# Patient Record
Sex: Male | Born: 1995 | Race: Black or African American | Hispanic: No | Marital: Single | Smoking: Never smoker
Health system: Southern US, Community
[De-identification: ages and names within clinical notes are randomized; demographics above are authoritative.]

## PROBLEM LIST (undated history)

## (undated) DIAGNOSIS — E669 Obesity, unspecified: Secondary | ICD-10-CM

---
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
## 2019-12-23 ENCOUNTER — Emergency Department (HOSPITAL_COMMUNITY): Payer: Medicaid Other

## 2019-12-23 ENCOUNTER — Emergency Department (HOSPITAL_COMMUNITY)
Admission: EM | Admit: 2019-12-23 | Discharge: 2019-12-24 | Disposition: A | Discharge status: Home / Self Care | Payer: Medicaid Other | Attending: Emergency Medicine | Admitting: Emergency Medicine

## 2019-12-23 ENCOUNTER — Encounter (HOSPITAL_COMMUNITY): Payer: Self-pay | Admitting: Emergency Medicine

## 2019-12-23 ENCOUNTER — Other Ambulatory Visit: Payer: Self-pay

## 2019-12-23 DIAGNOSIS — R072 Precordial pain: Secondary | ICD-10-CM | POA: Insufficient documentation

## 2019-12-23 LAB — CBC
HCT: 45.2 % (ref 39.0–52.0)
Hemoglobin: 14.4 g/dL (ref 13.0–17.0)
MCH: 28.3 pg (ref 26.0–34.0)
MCHC: 31.9 g/dL (ref 30.0–36.0)
MCV: 88.8 fL (ref 80.0–100.0)
Platelets: 289 10*3/uL (ref 150–400)
RBC: 5.09 MIL/uL (ref 4.22–5.81)
RDW: 12.8 % (ref 11.5–15.5)
WBC: 6.7 10*3/uL (ref 4.0–10.5)
nRBC: 0 % (ref 0.0–0.2)

## 2019-12-23 MED ORDER — SODIUM CHLORIDE 0.9% FLUSH: 3.0000 mL | Freq: Once | INTRAVENOUS | Status: DC

## 2019-12-23 NOTE — ED Triage Notes (Signed)
Patient reports intermittent central chest pain/tightness onset this evening , denies SOB , no emesis or diaphoresis , no cough or fever.

## 2019-12-24 LAB — BASIC METABOLIC PANEL
Anion gap: 9 (ref 5–15)
BUN: 11 mg/dL (ref 6–20)
CO2: 28 mmol/L (ref 22–32)
Calcium: 9.1 mg/dL (ref 8.9–10.3)
Chloride: 104 mmol/L (ref 98–111)
Creatinine, Ser: 0.94 mg/dL (ref 0.61–1.24)
GFR calc Af Amer: >60 mL/min (ref >60)
GFR calc non Af Amer: >60 mL/min (ref >60)
Glucose, Bld: 98 mg/dL (ref 70–99)
Potassium: 3.9 mmol/L (ref 3.5–5.1)
Sodium: 141 mmol/L (ref 135–145)

## 2019-12-24 LAB — TROPONIN I (HIGH SENSITIVITY)
Troponin I (High Sensitivity): <2 ng/L (ref <18)
Troponin I (High Sensitivity): <2 ng/L (ref <18)

## 2019-12-24 LAB — PROTIME-INR
INR: 1 (ref 0.8–1.2)
Prothrombin Time: 12.9 seconds (ref 11.4–15.2)

## 2019-12-24 MED ORDER — KETOROLAC TROMETHAMINE 30 MG/ML IJ SOLN
60.0000 mg | Freq: Once | INTRAMUSCULAR | Status: AC
  Administered 2019-12-24: 04:00:00 60 mg via INTRAMUSCULAR
  Filled 2019-12-24: qty 2

## 2019-12-24 NOTE — ED Notes (Signed)
Patient verbalizes understanding of discharge instructions. Opportunity for questioning and answers were provided. Armband removed by staff, pt discharged from ED.  

## 2019-12-24 NOTE — Discharge Instructions (Addendum)
There are no signs of heart attack or blood clots.  All your blood testing and x-rays look good You can take ibuprofen 3 times a day for a week.  I have suspicion you have inflammation in your chest  Your caregiver has diagnosed you as having chest pain that is not specific for one problem, but does not require admission.  Chest pain comes from many different causes.  SEEK IMMEDIATE MEDICAL ATTENTION IF: You have severe chest pain, especially if the pain is crushing or pressure-like and spreads to the arms, back, neck, or jaw, or if you have sweating, nausea (feeling sick to your stomach), or shortness of breath. THIS IS AN EMERGENCY. Don't wait to see if the pain will go away. Get medical help at once. Call 911 or 0 (operator). DO NOT drive yourself to the hospital.  Your chest pain gets worse and does not go away with rest.  You have an attack of chest pain lasting longer than usual, despite rest and treatment with the medications your caregiver has prescribed.  You wake from sleep with chest pain or shortness of breath.  You feel dizzy or faint.  You have chest pain not typical of your usual pain for which you originally saw your caregiver.

## 2019-12-24 NOTE — ED Provider Notes (Signed)
[facility] EMERGENCY DEPARTMENT Provider Note   CSN: [id number] Arrival date & time: 12/23/19  2305     History Chief Complaint  Patient presents with  . Chest Pain    PEDRO WHITERS is a 24 y.o. male.  The history is provided by the patient.  Chest Pain Associated symptoms: no cough, no diaphoresis, no fever, no shortness of breath and no vomiting     HPI: A 24 year old patient presents for evaluation of chest pain. Initial onset of pain was more than 6 hours ago. The patient's chest pain is described as heaviness/pressure/tightness and is not worse with exertion. The patient's chest pain is not middle- or left-sided, is not well-localized, is not sharp and does not radiate to the arms/jaw/neck. The patient does not complain of nausea and denies diaphoresis. The patient has no history of stroke, has no history of peripheral artery disease, has not smoked in the past 90 days, denies any history of treated diabetes, has no relevant family history of coronary artery disease (first degree relative at less than age 51), is not hypertensive, has no history of hypercholesterolemia and does not have an elevated BMI (>=30).  Patient reports he has had squeezing chest pain for over a month.  He feels that it got worse tonight.  He reports it feels like "small hands are squeezing my heart " He has no other associated symptoms.  He denies fever/vomiting/cough/shortness of breath.  No diaphoresis.  No history of CAD/VTE.  She denies any significant anxiety  PMH-none Social History   Tobacco Use  . Smoking status: Never Smoker  . Smokeless tobacco: Never Used  Substance Use Topics  . Alcohol use: Never  . Drug use: Never    Home Medications Prior to Admission medications   Not on File    Allergies    Patient has no known allergies.  Review of Systems   Review of Systems  Constitutional: Negative for diaphoresis and fever.  Respiratory: Negative for cough and  shortness of breath.   Cardiovascular: Positive for chest pain.  Gastrointestinal: Negative for vomiting.  All other systems reviewed and are negative.   Physical Exam Updated Vital Signs BP 125/82   Pulse 92   Temp 98 F (36.7 C) (Oral)   Resp (!) 22   Ht 1.803 m (5\' 11" )   Wt 110 kg   SpO2 100%   BMI 33.82 kg/m   Physical Exam CONSTITUTIONAL: Well developed/well nourished HEAD: Normocephalic/atraumatic EYES: EOMI ENMT: Mucous membranes moist NECK: supple no meningeal signs SPINE/BACK:entire spine nontender CV: S1/S2 noted, no murmurs/rubs/gallops noted LUNGS: Lungs are clear to auscultation bilaterally, no apparent distress Chest-no tenderness to palpation ABDOMEN: soft, nontender NEURO: Pt is awake/alert/appropriate, moves all extremitiesx4.  No facial droop.   EXTREMITIES: pulses normal/equal, full ROM, no calf tenderness or edema SKIN: warm, color normal PSYCH: Flat affect, poor eye contact  ED Results / Procedures / Treatments   Labs (all labs ordered are listed, but only abnormal results are displayed) Labs Reviewed  BASIC METABOLIC PANEL  CBC  PROTIME-INR  TROPONIN I (HIGH SENSITIVITY)  TROPONIN I (HIGH SENSITIVITY)    EKG EKG Interpretation  Date/Time:  Thursday December 24 2019 03:17:03 EST Ventricular Rate:  77 PR Interval:    QRS Duration: 90 QT Interval:  325 QTC Calculation: 368 R Axis:   21 Text Interpretation: Sinus rhythm RSR' in V1 or V2, probably normal variant Probable inferior infarct, old Lateral leads are also involved No previous ECGs available  Confirmed by Ripley Fraise [phone]) on 12/24/2019 3:28:55 AM   Radiology DG Chest 2 View  Result Date: 12/23/2019 CLINICAL DATA:  Chest pain EXAM: CHEST - 2 VIEW COMPARISON:  11/10/2007 FINDINGS: The heart size and mediastinal contours are within normal limits. Both lungs are clear. The visualized skeletal structures are unremarkable. IMPRESSION: No active cardiopulmonary disease.  Electronically Signed   By: Donavan Foil M.D.   On: 12/23/2019 23:31    Procedures Procedures   Medications Ordered in ED Medications  sodium chloride flush (NS) 0.9 % injection 3 mL (has no administration in time range)  ketorolac (TORADOL) 30 MG/ML injection 60 mg (60 mg Intramuscular Given 12/24/19 0344)    ED Course  I have reviewed the triage vital signs and the nursing notes.  Pertinent labs & imaging results that were available during my care of the patient were reviewed by me and considered in my medical decision making (see chart for details).    MDM Rules/Calculators/A&P HEAR Score: 1                    Patient reports chest pain for over a month that appeared to worsen at night.  He reports it feels like a squeezing, but no other acute symptom Patient feels improved and is requesting discharge home.  Low suspicion for ACS/PE/dissection at this time.  We discussed return precautions He declines to have me call his family Final Clinical Impression(s) / ED Diagnoses Final diagnoses:  Precordial pain    Rx / DC Orders ED Discharge Orders    None       Ripley Fraise, MD 12/24/19 0501

## 2021-10-14 ENCOUNTER — Telehealth: Payer: Medicaid Other | Admitting: Nurse Practitioner

## 2021-10-14 DIAGNOSIS — L089 Local infection of the skin and subcutaneous tissue, unspecified: Secondary | ICD-10-CM

## 2021-10-14 MED ORDER — SULFAMETHOXAZOLE-TRIMETHOPRIM 800-160 MG PO TABS: 1.0000 | ORAL_TABLET | Freq: Two times a day (BID) | ORAL | 0 refills | Status: AC

## 2021-10-14 NOTE — Progress Notes (Signed)
Virtual Visit Consent   Cody Weaver, you are scheduled for a virtual visit with a [facility] provider today.     Just as with appointments in the office, your consent must be obtained to participate.  Your consent will be active for this visit and any virtual visit you may have with one of our providers in the next 365 days.     If you have a MyChart account, a copy of this consent can be sent to you electronically.  All virtual visits are billed to your insurance company just like a traditional visit in the office.    As this is a virtual visit, video technology does not allow for your provider to perform a traditional examination.  This may limit your provider's ability to fully assess your condition.  If your provider identifies any concerns that need to be evaluated in person or the need to arrange testing (such as labs, EKG, etc.), we will make arrangements to do so.     Although advances in technology are sophisticated, we cannot ensure that it will always work on either your end or our end.  If the connection with a video visit is poor, the visit may have to be switched to a telephone visit.  With either a video or telephone visit, we are not always able to ensure that we have a secure connection.     I need to obtain your verbal consent now.   Are you willing to proceed with your visit today?    LAYTON NAVES has provided verbal consent on 10/14/2021 for a virtual visit (video or telephone).   Viviano Simas, FNP   Date: 10/14/2021 1:02 PM   Virtual Visit via Video Note   I, Viviano Simas, connected with  Cody Weaver  ([id number], 07/02/1996) on 10/14/21 at  1:00 PM EST by a video-enabled telemedicine application and verified that I am speaking with the correct person using two identifiers.  Location: Patient: Virtual Visit Location Patient: Home Provider: Virtual Visit Location Provider: Home Office   I discussed the limitations of evaluation and management by  telemedicine and the availability of in person appointments. The patient expressed understanding and agreed to proceed.    History of Present Illness: Cody Weaver is a 25 y.o. who identifies as a male who was assigned male at birth, and is being seen today with complaints of a rash on his scalp for the past 2 months. He was seen by a dermatologist and was diagnosed with folliculitis. He has been treated with ketoconazole shampoo, doxycyline oral and clindamycin topical.   Since that time the rash has continued and become more painful. The rash has spread and he feels that it is in a trail pattern. The rash only hurts it does not itch.   If he rolls over onto the area it will wake him up from pain.   He finished the oral antibiotics two months ago.    Problems: There are no problems to display for this patient.   Allergies: No Known Allergies Medications: No current outpatient medications on file.  Observations/Objective: Patient is well-developed, well-nourished in no acute distress.  Resting comfortably at home.  Head is normocephalic, atraumatic.  No labored breathing.  Speech is clear and coherent with logical content.  Patient is alert and oriented at baseline.  Rash to scalp left side with loss of hair to areas affected red and swollen scalp without obvious drainage   Assessment and Plan: 1. Skin  infection  - sulfamethoxazole-trimethoprim (BACTRIM DS) 800-160 MG tablet; Take 1 tablet by mouth 2 (two) times daily for 10 days.  Dispense: 20 tablet; Refill: 0    Stop any topical regimen, follow up with dermatology next week for assessment and culture as needed  Follow Up Instructions: I discussed the assessment and treatment plan with the patient. The patient was provided an opportunity to ask questions and all were answered. The patient agreed with the plan and demonstrated an understanding of the instructions.  A copy of instructions were sent to the patient via MyChart  unless otherwise noted below.     The patient was advised to call back or seek an in-person evaluation if the symptoms worsen or if the condition fails to improve as anticipated.  Time:  I spent  15 minutes with the patient via telehealth technology discussing the above problems/concerns.    Viviano Simas, FNP

## 2021-12-13 ENCOUNTER — Other Ambulatory Visit: Payer: Self-pay | Admitting: Nurse Practitioner

## 2021-12-13 DIAGNOSIS — N5082 Scrotal pain: Secondary | ICD-10-CM

## 2021-12-17 ENCOUNTER — Telehealth: Payer: Self-pay

## 2021-12-17 NOTE — Progress Notes (Unsigned)
The patient no-showed for appointment despite this provider sending direct link, reaching out via phone with no response and waiting for at least 10 minutes from appointment time for patient to join. They will be marked as a NS for this appointment/time.   Genelle Economou J Sydne Krahl-Warren, NP    

## 2022-01-16 ENCOUNTER — Telehealth: Payer: Self-pay | Admitting: Physician Assistant

## 2022-01-16 DIAGNOSIS — L989 Disorder of the skin and subcutaneous tissue, unspecified: Secondary | ICD-10-CM

## 2022-01-16 NOTE — Progress Notes (Signed)
?Virtual Visit Consent  ? ?Cody Weaver, you are scheduled for a virtual visit with a [facility] provider today.   ?  ?Just as with appointments in the office, your consent must be obtained to participate.  Your consent will be active for this visit and any virtual visit you may have with one of our providers in the next 365 days.   ?  ?If you have a MyChart account, a copy of this consent can be sent to you electronically.  All virtual visits are billed to your insurance company just like a traditional visit in the office.   ? ?As this is a virtual visit, video technology does not allow for your provider to perform a traditional examination.  This may limit your provider's ability to fully assess your condition.  If your provider identifies any concerns that need to be evaluated in person or the need to arrange testing (such as labs, EKG, etc.), we will make arrangements to do so.   ?  ?Although advances in technology are sophisticated, we cannot ensure that it will always work on either your end or our end.  If the connection with a video visit is poor, the visit may have to be switched to a telephone visit.  With either a video or telephone visit, we are not always able to ensure that we have a secure connection.    ? ?I need to obtain your verbal consent now.   Are you willing to proceed with your visit today?  ?  ?Cody Weaver has provided verbal consent on 01/16/2022 for a virtual visit (video or telephone). ?  ?Piedad Climes, PA-C  ? ?Date: 01/16/2022 12:11 PM ? ? ?Virtual Visit via Video Note  ? ?IPiedad Climes, connected with  Cody Weaver  ([id number], [birth year]) on 01/16/22 at 10:45 AM EDT by a video-enabled telemedicine application and verified that I am speaking with the correct person using two identifiers. ? ?Location: ?Patient: Virtual Visit Location Patient: Home ?Provider: Virtual Visit Location Provider: Home Office ?  ?I discussed the limitations of evaluation and  management by telemedicine and the availability of in person appointments. The patient expressed understanding and agreed to proceed.   ? ?History of Present Illness: ?Cody Weaver is a 26 y.o. who identifies as a male who was assigned male at birth, and is being seen today for  ongoing lesions of scalp. Notes issue starting about 5-6 months ago in which he started with development of rash and pustules/boils of scalp. Was seen by PCP and treated for folliculitis with continued symptoms. As such was sent to Dermatology. Notes having a bad experience as the provider spent < 5 minutes with him and gave more antibiotics. Notes he has scarring of scalp from these boils and some lesions under the skin that are non-tender or painful but causing loss of hair of overlying scalp. Is unsure what to do. Denies any painful or tender lesions at present, drainage, fever, etc.  ? ? ?HPI: HPI  ?Problems: There are no problems to display for this patient. ?  ?Allergies: No Known Allergies ?Medications:  ?Current Outpatient Medications:  ?  clindamycin (CLINDAGEL) 1 % gel, SMARTSIG:1 Sparingly Topical Twice Daily, Disp: , Rfl:  ?  fluconazole (DIFLUCAN) 200 MG tablet, Take 200 mg by mouth daily., Disp: , Rfl:  ?  ketoconazole (NIZORAL) 2 % shampoo, Apply topically., Disp: , Rfl:  ?  Vitamin D, Ergocalciferol, (DRISDOL) 1.25 MG (50000 UNIT) CAPS capsule,  Take 50,000 Units by mouth once a week., Disp: , Rfl:  ? ?Observations/Objective: ?Patient is well-developed, well-nourished in no acute distress.  ?Resting comfortably at home.  ?Head is normocephalic, atraumatic.  ?No labored breathing. ?Speech is clear and coherent with logical content.  ?Patient is alert and oriented at baseline.  ? ?Assessment and Plan: ?1. Lesion of skin of scalp ? ?Ongoing issue with recurrent folliculitis (question HS actually giving patient note of boils and scarring) without symptoms of active infection. Also with lesion of scalp sounding more like a  cyst. Bad prior experience with a dermatology office. Recommendations given. He can contact directly if insurance does not require a referral. Supportive measures reviewed.  ? ?Follow Up Instructions: ?I discussed the assessment and treatment plan with the patient. The patient was provided an opportunity to ask questions and all were answered. The patient agreed with the plan and demonstrated an understanding of the instructions.  A copy of instructions were sent to the patient via MyChart unless otherwise noted below.  ? ?The patient was advised to call back or seek an in-person evaluation if the symptoms worsen or if the condition fails to improve as anticipated. ? ?Time:  ?I spent 15 minutes with the patient via telehealth technology discussing the above problems/concerns.   ? ?Piedad Climes, PA-C ?

## 2022-01-16 NOTE — Patient Instructions (Addendum)
?  Cody Weaver, thank you for joining Piedad Climes, PA-C for today's virtual visit.  While this provider is not your primary care provider (PCP), if your PCP is located in our provider database this encounter information will be shared with them immediately following your visit. ? ?Consent: ?(Patient) Cody Weaver provided verbal consent for this virtual visit at the beginning of the encounter. ? ?Current Medications: ? ?Current Outpatient Medications:  ?  clindamycin (CLINDAGEL) 1 % gel, SMARTSIG:1 Sparingly Topical Twice Daily, Disp: , Rfl:  ?  fluconazole (DIFLUCAN) 200 MG tablet, Take 200 mg by mouth daily., Disp: , Rfl:  ?  ketoconazole (NIZORAL) 2 % shampoo, Apply topically., Disp: , Rfl:  ?  Vitamin D, Ergocalciferol, (DRISDOL) 1.25 MG (50000 UNIT) CAPS capsule, Take 50,000 Units by mouth once a week., Disp: , Rfl:   ? ?Medications ordered in this encounter:  ?No orders of the defined types were placed in this encounter. ?  ? ?*If you need refills on other medications prior to your next appointment, please contact your pharmacy* ? ?Follow-Up: ?Call back or seek an in-person evaluation if the symptoms worsen or if the condition fails to improve as anticipated. ? ?Other Instructions ?Keep scalp clean and dry. ?I recommend getting some OTC Hibiclens solution to apply 1-2 x week during your shower and rinsing off. This will cut back on the bacterial load in the area and make infections less likely.  ? ?I would recommend looking into Dr. Nicholas Lose with [facility] [phone]), or [facility] [phone]) to get set up with a provider. You may need to contact your PCP to place a referral for you.  ? ? ?If you have been instructed to have an in-person evaluation today at a local Urgent Care facility, please use the link below. It will take you to a list of all of our available [facility], including address, phone number and hours of operation. Please do not  delay care.  ?[facility] ? ?If you or a family member do not have a primary care provider, use the link below to schedule a visit and establish care. When you choose a [facility] primary care physician or advanced practice provider, you gain a long-term partner in health. ?Find a Primary Care Provider ? ?Learn more about [facility]'s in-office and virtual care options: ?[facility] - Get Care Now  ?

## 2022-02-10 ENCOUNTER — Ambulatory Visit (HOSPITAL_COMMUNITY): Payer: Self-pay

## 2022-02-12 ENCOUNTER — Ambulatory Visit (HOSPITAL_COMMUNITY)
Admission: RE | Admit: 2022-02-12 | Discharge: 2022-02-12 | Disposition: A | Discharge status: Home / Self Care | Payer: Medicaid Other | Source: Ambulatory Visit | Attending: Sports Medicine | Admitting: Sports Medicine

## 2022-02-12 ENCOUNTER — Encounter (HOSPITAL_COMMUNITY): Payer: Self-pay

## 2022-02-12 VITALS — BP 117/89 | HR 84 | Temp 97.5°F | Resp 18

## 2022-02-12 DIAGNOSIS — L7211 Pilar cyst: Secondary | ICD-10-CM

## 2022-02-12 DIAGNOSIS — L739 Follicular disorder, unspecified: Secondary | ICD-10-CM

## 2022-02-12 DIAGNOSIS — L299 Pruritus, unspecified: Secondary | ICD-10-CM

## 2022-02-12 DIAGNOSIS — R4589 Other symptoms and signs involving emotional state: Secondary | ICD-10-CM

## 2022-02-12 NOTE — ED Provider Notes (Signed)
?[facility] ? ? ? ?CSN: [id number] ?Arrival date & time: 02/12/22  1031 ? ? ?  ? ?History   ?Chief Complaint ?Chief Complaint  ?Patient presents with  ? Abscess  ? ? ?HPI ?KEYSHAWN SNIDE is a 26 y.o. male here for reported abscess ? ?Patient reports he has abscess-like lesion on the left side of the scalp just superior to his left ear. He states this has grown in size slightly over the last few months. Initially noticed it about 5 months ago. States sometimes it will spontaneously drain small amount of yellow-fluid, but has never gone away. Hurts at times when he lays on that side. ? ?He also states he has folliculitis on the tops of his head. He has been seen for this before at UC/ED and states saw a dermatologist about 2 months ago and was diagnosed with folliculitis. He has been treated in the past with: ?Has taken diflucan before ?Used Nizoral shampoo and clindamycin 1% gel - helped with itching and burning ? ?Has seen dermatology in the past - about 2 months ago, unsure where this was located. ? ?Otherwise, he feels well. Denies any fever/chills. No current headaches or dizziness. He would like the lesion drained today. ? ?History reviewed. No pertinent past medical history. ? ?There are no problems to display for this patient. ? ? ?History reviewed. No pertinent surgical history. ? ? ? ? ?Home Medications   ? ?Prior to Admission medications   ?Medication Sig Start Date End Date Taking? Authorizing Provider  ?clindamycin (CLINDAGEL) 1 % gel SMARTSIG:1 Sparingly Topical Twice Daily 08/10/21   [provider]  ?fluconazole (DIFLUCAN) 200 MG tablet Take 200 mg by mouth daily. 06/12/21   [provider]  ?ketoconazole (NIZORAL) 2 % shampoo Apply topically. 09/28/21   [provider]  ?Vitamin D, Ergocalciferol, (DRISDOL) 1.25 MG (50000 UNIT) CAPS capsule Take 50,000 Units by mouth once a week. 09/28/21   [provider]  ? ? ?Family History ?History reviewed. No  pertinent family history. ? ?Social History ?Social History  ? ?Tobacco Use  ? Smoking status: Never  ? Smokeless tobacco: Never  ?Substance Use Topics  ? Alcohol use: Never  ? Drug use: Never  ? ? ? ?Allergies   ?Patient has no known allergies. ? ? ?Review of Systems ?Review of Systems  ?Constitutional:  Negative for activity change, chills and fever.  ?HENT:    ?     + mass on left scalp ?+ pruritic scalp ?+ loss of hair on top of scalp  ?Eyes:  Negative for visual disturbance.  ?Neurological:  Negative for dizziness and headaches.  ? ? ?Physical Exam ?Triage Vital Signs ?ED Triage Vitals  ?Enc Vitals Group  ?   BP 02/12/22 1056 117/89  ?   Pulse Rate 02/12/22 1056 84  ?   Resp 02/12/22 1056 18  ?   Temp 02/12/22 1056 (!) 97.5 ?F (36.4 ?C)  ?   Temp Source 02/12/22 1056 Oral  ?   SpO2 02/12/22 1056 97 %  ?   Weight --   ?   Height --   ?   Head Circumference --   ?   Peak Flow --   ?   Pain Score 02/12/22 1055 5  ?   Pain Loc --   ?   Pain Edu? --   ?   Excl. in [place]? --   ? ?No data found. ? ?Updated Vital Signs ?BP 117/89 (BP Location: Right  Arm)   Pulse 84   Temp (!) 97.5 ?F (36.4 ?C) (Oral)   Resp 18   SpO2 97%  ? ? ?Physical Exam ?Constitutional:   ?   General: He is not in acute distress. ?   Appearance: Normal appearance. He is not toxic-appearing.  ?HENT:  ?   Head: Normocephalic.  ?   Comments: + raised nodular like lesion on top of scalp with hair loss ?+  left scalp with raised cystic mass approx 3 x 3 cm, slightly fluctuant ?No surrounding erythema ?+ pruritis on top of scalp ?   Mouth/Throat:  ?   Mouth: Mucous membranes are moist.  ?Eyes:  ?   Extraocular Movements: Extraocular movements intact.  ?   Conjunctiva/sclera: Conjunctivae normal.  ?   Pupils: Pupils are equal, round, and reactive to light.  ?Cardiovascular:  ?   Rate and Rhythm: Normal rate.  ?Pulmonary:  ?   Effort: Pulmonary effort is normal. No respiratory distress.  ?Musculoskeletal:  ?   Cervical back: Normal range of motion.   ?Skin: ?   Capillary Refill: Capillary refill takes less than 2 seconds.  ?   Findings: Erythema present.  ?   Comments: + pruritis  ?Neurological:  ?   Mental Status: He is alert.  ?Psychiatric:  ?   Comments: + depressed mood ?+ flat affect  ? ? ? ?UC Treatments / Results  ?Labs ?(all labs ordered are listed, but only abnormal results are displayed) ?Labs Reviewed - No data to display ? ?EKG ? ? ?Radiology ?No results found. ? ?Procedures ?Procedures (including critical care time) ? ?Medications Ordered in UC ?Medications - No data to display ? ?Initial Impression / Assessment and Plan / UC Course  ?I have reviewed the triage vital signs and the nursing notes. ? ?Pertinent labs & imaging results that were available during my care of the patient were reviewed by me and considered in my medical decision making (see chart for details). ? ?  ? ?Patient presents with about 5 months of follicular like reaction on the top of the scalp that is pruritic.  Also appears on the left side of the scalp just superior to the ear with what looks to be a pilar cyst versus sebaceous cyst of the scalp.  This is rather large, approximately 3 x 3 cm in nature.  This is causing cosmetic concern and some pain with sleeping on that side.  We discussed options such as attempted removal, however due to the cystic structure of the mass, this would likely need removal from otology or skin surgery.  Information with dermatology and skin surgery center provided for patient to follow-up with.  He may also follow-up with the previous dermatologist he saw.  He is to continue the Nizoral shampoo and clindamycin gel topically.  Return precautions provided. ?Final Clinical Impressions(s) / UC Diagnoses  ? ?Final diagnoses:  ?Folliculitis  ?Scalp itch  ?Pilar cyst of scalp  ?Flat affect  ? ? ? ?Discharge Instructions   ? ?  ?Continue Nizoral shampoo and clindamycin gel on the top of the head ? ?Call 1 of these dermatology offices for removal of your  sebaceous cyst/pilar cyst on the side of your head ? ? ? ? ?ED Prescriptions   ?None ?  ? ?PDMP not reviewed this encounter. ?  Elba Barman, DO ?02/12/22 1154 ? ?

## 2022-02-12 NOTE — Discharge Instructions (Signed)
Continue Nizoral shampoo and clindamycin gel on the top of the head ? ?Call 1 of these dermatology offices for removal of your sebaceous cyst/pilar cyst on the side of your head ?

## 2022-02-12 NOTE — ED Triage Notes (Signed)
Pt presents with an abscess on the L side of the head.  ?

## 2022-03-14 ENCOUNTER — Emergency Department (HOSPITAL_COMMUNITY)
Admission: EM | Admit: 2022-03-14 | Discharge: 2022-03-14 | Disposition: A | Discharge status: Home / Self Care | Payer: Medicaid Other | Attending: Emergency Medicine | Admitting: Emergency Medicine

## 2022-03-14 ENCOUNTER — Encounter (HOSPITAL_COMMUNITY): Payer: Self-pay | Admitting: Emergency Medicine

## 2022-03-14 ENCOUNTER — Emergency Department (HOSPITAL_COMMUNITY): Payer: Medicaid Other

## 2022-03-14 ENCOUNTER — Other Ambulatory Visit: Payer: Self-pay

## 2022-03-14 DIAGNOSIS — R0789 Other chest pain: Secondary | ICD-10-CM

## 2022-03-14 DIAGNOSIS — K219 Gastro-esophageal reflux disease without esophagitis: Secondary | ICD-10-CM | POA: Insufficient documentation

## 2022-03-14 HISTORY — DX: Obesity, unspecified: E66.9

## 2022-03-14 LAB — BASIC METABOLIC PANEL
Anion gap: 8 (ref 5–15)
BUN: 13 mg/dL (ref 6–20)
CO2: 24 mmol/L (ref 22–32)
Calcium: 9.4 mg/dL (ref 8.9–10.3)
Chloride: 106 mmol/L (ref 98–111)
Creatinine, Ser: 0.89 mg/dL (ref 0.61–1.24)
GFR, Estimated: >60 mL/min (ref >60)
Glucose, Bld: 94 mg/dL (ref 70–99)
Potassium: 4 mmol/L (ref 3.5–5.1)
Sodium: 138 mmol/L (ref 135–145)

## 2022-03-14 LAB — CBC
HCT: 46.7 % (ref 39.0–52.0)
Hemoglobin: 15.1 g/dL (ref 13.0–17.0)
MCH: 28.5 pg (ref 26.0–34.0)
MCHC: 32.3 g/dL (ref 30.0–36.0)
MCV: 88.3 fL (ref 80.0–100.0)
Platelets: 262 10*3/uL (ref 150–400)
RBC: 5.29 MIL/uL (ref 4.22–5.81)
RDW: 13.1 % (ref 11.5–15.5)
WBC: 5.6 10*3/uL (ref 4.0–10.5)
nRBC: 0 % (ref 0.0–0.2)

## 2022-03-14 LAB — TROPONIN I (HIGH SENSITIVITY): Troponin I (High Sensitivity): 3 ng/L (ref <18)

## 2022-03-14 MED ORDER — DICYCLOMINE HCL 20 MG PO TABS: 20.0000 mg | ORAL_TABLET | Freq: Two times a day (BID) | ORAL | 0 refills | Status: DC

## 2022-03-14 MED ORDER — OMEPRAZOLE 20 MG PO CPDR: 20.0000 mg | DELAYED_RELEASE_CAPSULE | Freq: Every day | ORAL | 1 refills | Status: AC

## 2022-03-14 MED ORDER — DICYCLOMINE HCL 10 MG PO CAPS
10.0000 mg | ORAL_CAPSULE | Freq: Once | ORAL | Status: AC
Start: 2022-03-14 — End: 2022-03-14
  Administered 2022-03-14: 10 mg via ORAL
  Filled 2022-03-14: qty 1

## 2022-03-14 MED ORDER — ALUM & MAG HYDROXIDE-SIMETH 200-200-20 MG/5ML PO SUSP
30.0000 mL | Freq: Once | ORAL | Status: AC
  Administered 2022-03-14: 30 mL via ORAL
  Filled 2022-03-14: qty 30

## 2022-03-14 MED ORDER — LIDOCAINE VISCOUS HCL 2 % MT SOLN
15.0000 mL | Freq: Once | OROMUCOSAL | Status: AC
  Administered 2022-03-14: 15 mL via ORAL
  Filled 2022-03-14: qty 15

## 2022-03-14 NOTE — Discharge Instructions (Addendum)
I believe you are having symptoms of acid reflux.  I have attached information about this to these discharge papers.  Avoid fried and spicy foods for the time being.  I have also sent medication to your pharmacy. ? ?Omeprazole is the first medication.  This should be taken on an empty stomach in the morning and you should not eat anything for 30 minutes after.  Dicyclomine is a second pill that is a twice daily medication.  Follow-up with the clinic attached to these discharge papers if you are no longer established with Dr. Radene Ou. ? ?Return with any worsening symptoms. ?

## 2022-03-14 NOTE — ED Triage Notes (Signed)
Patient arrives ambulatory c/o central chest tightness, feels like someone his grabbing and squeezing his heart x 2 days.  ?

## 2022-03-14 NOTE — ED Provider Triage Note (Signed)
Emergency Medicine Provider Triage Evaluation Note  Cody Weaver , a 26 y.o. male  was evaluated in triage.  Pt complains of 7-8 months ago had a similar episode. 2 days ago in the AM had pressure but it went away similar ep today. Now it doesn't "hurt but is uncomfortable"   Sternal and nonradiating Non exertional  No SOB, nausea, vomiting, sweating  No hx of HTN, tobacco, DM, HLD  No drug use  Review of Systems  Positive: CP Negative: Fever   Physical Exam  BP (!) 136/93 (BP Location: Left Arm)   Pulse 80   Temp 98.5 F (36.9 C) (Oral)   Resp 18   Ht 5\' 11"  (1.803 m)   Wt 131.1 kg   SpO2 100%   BMI 40.31 kg/m  Gen:   Awake, no distress   Resp:  Normal effort MSK:   Moves extremities without difficulty  Other:    Medical Decision Making  Medically screening exam initiated at 12:51 PM.  Appropriate orders placed.  was informed that the remainder of the evaluation will be completed by another provider, this initial triage assessment does not replace that evaluation, and the importance of remaining in the ED until their evaluation is complete.  He thinks it is stress.    [name], Gailen Shelter 03/14/22 1255

## 2022-03-14 NOTE — ED Provider Notes (Signed)
?[facility] EMERGENCY DEPARTMENT ?Provider Note ? ? ?CSN: [id number] ?Arrival date & time: 03/14/22  1206 ? ?  ? ?History ? ?Chief Complaint  ?Patient presents with  ? Chest Pain  ? ? ?Cody Weaver is a 26 y.o. male presenting with a complaint of chest tightness.  [place] Spaniel that this started today go away.  This happened 1 time 7 years ago and it went away on its own however he cannot get this to go away.  He is trying any medications.  No history of ACS or known medical history in general.  Denies recent travel, surgery, history of DVT/PE or tobacco use.  Says the pain does not radiate and feels just like a squeezing in his chest.  Says that he also may be under increased stress.  He also believes that this started after eating a spicy chicken sandwich. ? ?Chest Pain ?Associated symptoms: no cough, no palpitations and no shortness of breath   ? ?  ? ?Home Medications ?Prior to Admission medications   ?Medication Sig Start Date End Date Taking? Authorizing Provider  ?clindamycin (CLINDAGEL) 1 % gel SMARTSIG:1 Sparingly Topical Twice Daily 08/10/21   [provider]  ?fluconazole (DIFLUCAN) 200 MG tablet Take 200 mg by mouth daily. 06/12/21   [provider]  ?ketoconazole (NIZORAL) 2 % shampoo Apply topically. 09/28/21   [provider]  ?Vitamin D, Ergocalciferol, (DRISDOL) 1.25 MG (50000 UNIT) CAPS capsule Take 50,000 Units by mouth once a week. 09/28/21   [provider]  ?   ? ?Allergies    ?Patient has no known allergies.   ? ?Review of Systems   ?Review of Systems  ?Respiratory:  Positive for chest tightness. Negative for cough and shortness of breath.   ?Cardiovascular:  Negative for chest pain and palpitations.  ? ?Physical Exam ?Updated Vital Signs ?BP 119/74 (BP Location: Right Arm)   Pulse 83   Temp 97.6 ?F (36.4 ?C) (Oral)   Resp 16   Ht 5\' 11"  (1.803 m)   Wt 131.1 kg   SpO2 100%   BMI 40.31 kg/m?  ?Physical Exam ?Vitals and nursing note reviewed.   ?Constitutional:   ?   General: He is not in acute distress. ?   Appearance: Normal appearance. He is not ill-appearing.  ?HENT:  ?   Head: Normocephalic and atraumatic.  ?Eyes:  ?   General: No scleral icterus. ?   Conjunctiva/sclera: Conjunctivae normal.  ?Cardiovascular:  ?   Rate and Rhythm: Normal rate and regular rhythm.  ?Pulmonary:  ?   Effort: Pulmonary effort is normal. No respiratory distress.  ?   Breath sounds: Normal breath sounds.  ?Skin: ?   Findings: No rash.  ?Neurological:  ?   Mental Status: He is alert.  ?Psychiatric:     ?   Mood and Affect: Mood normal.  ? ? ?ED Results / Procedures / Treatments   ?Labs ?(all labs ordered are listed, but only abnormal results are displayed) ?Labs Reviewed  ?BASIC METABOLIC PANEL  ?CBC  ?TROPONIN I (HIGH SENSITIVITY)  ? ? ?EKG ?EKG Interpretation ? ?Date/Time:  Wednesday Mar 14 2022 12:37:00 EDT ?Ventricular Rate:  80 ?PR Interval:  174 ?QRS Duration: 82 ?QT Interval:  348 ?QTC Calculation: 401 ?R Axis:   58 ?Text Interpretation: Normal sinus rhythm Normal ECG Confirmed by [id number] (Cathren Laine) on 03/14/2022 3:22:20 PM ? ?Radiology ?DG Chest 2 View ? ?Result Date: 03/14/2022 ?CLINICAL DATA:  chest pain EXAM: CHEST - 2 VIEW  COMPARISON:  December 23, 2019 FINDINGS: The heart size and mediastinal contours are within normal limits. Both lungs are clear. The visualized skeletal structures are unremarkable. IMPRESSION: No active cardiopulmonary disease. Electronically Signed   By: Marjo Bicker M.D.   On: 03/14/2022 13:07   ? ?Procedures ?Procedures  ? ?Medications Ordered in ED ?Medications  ?alum & mag hydroxide-simeth (MAALOX/MYLANTA) 200-200-20 MG/5ML suspension 30 mL (has no administration in time range)  ?  And  ?lidocaine (XYLOCAINE) 2 % viscous mouth solution 15 mL (has no administration in time range)  ?dicyclomine (BENTYL) 10 MG/5ML solution 10 mg (has no administration in time range)  ? ? ?ED Course/ Medical Decision Making/ A&P ?  ?                         ?Medical Decision Making ?Amount and/or Complexity of Data Reviewed ?Labs: ordered. ?Radiology: ordered. ? ?Risk ?OTC drugs. ?Prescription drug management. ? ? ?This patient presents to the ED for concern of chest tightness. The emergent differential diagnosis of chest pain includes: Acute coronary syndrome, pericarditis, aortic dissection, pulmonary embolism, tension pneumothorax, and esophageal rupture. ? ?  ?This is not an exhaustive differential.  ?  ?Past Medical History / Co-morbidities / Social History: ?None ?  ?Additional history: ?Additional history obtained from internal chart review patient presented the same way in 2021. ?  ?Physical Exam: ?Physical exam performed.  No pertinent findings ? ?Lab Tests: ?I ordered, and personally interpreted labs.  No pertinent results ?  ?Imaging Studies: ?I ordered imaging studies including chest x-ray. I independently visualized and interpreted imaging which showed no abnormalities. I agree with the radiologist interpretation. ? ?  ?Medications: ?I ordered medication including GI cocktail. Reevaluation of the patient after these medicines showed that the patient improved. I have reviewed the patients home medicines and have made adjustments as needed. ?  ?Disposition: ?After consideration of the diagnostic results and the patients response to treatment, I feel that he does not meet admission criteria.  I did consider admission due to the severity of the complaint of chest pain however patient's cardiac work-up is negative.  Low suspicion for PE as he is PERC negative.  Believe that patient is experiencing reflux secondary to his diet.  He agrees that this is reasonable and that he will try to decrease his spicy and fried food intake.  Omeprazole and Bentyl have been sent to his pharmacy.  Return precautions discussed ? ?Final Clinical Impression(s) / ED Diagnoses ?Final diagnoses:  ?Feeling of chest tightness  ?Gastroesophageal reflux disease, unspecified whether  esophagitis present  ? ? ?Rx / DC Orders ?ED Discharge Orders   ? ?      Ordered  ?  dicyclomine (BENTYL) 20 MG tablet  2 times daily       ? 03/14/22 1626  ?  omeprazole (PRILOSEC) 20 MG capsule  Daily       ? 03/14/22 1626  ? ?  ?  ? ?  ? ?Results and diagnoses were explained to the patient. Return precautions discussed in full. Patient had no additional questions and expressed complete understanding. ? ? ?This chart was dictated using voice recognition software.  Despite best efforts to proofread,  errors can occur which can change the documentation meaning.  ?  ?Saddie Benders, PA-C ?03/14/22 1630 ? ?  ?Cathren Laine, MD ?03/14/22 1751 ? ?

## 2022-07-03 ENCOUNTER — Ambulatory Visit (HOSPITAL_COMMUNITY): Admit: 2022-07-03 | Payer: Self-pay

## 2022-07-19 ENCOUNTER — Ambulatory Visit (HOSPITAL_COMMUNITY)
Admission: EM | Admit: 2022-07-19 | Discharge: 2022-07-19 | Disposition: A | Discharge status: Home / Self Care | Payer: No Payment, Other | Attending: Psychiatry | Admitting: Psychiatry

## 2022-07-19 DIAGNOSIS — F419 Anxiety disorder, unspecified: Secondary | ICD-10-CM | POA: Insufficient documentation

## 2022-07-19 NOTE — ED Provider Notes (Signed)
Behavioral Health Urgent Care Medical Screening Exam  Patient Name: Cody Weaver MRN: [id number] Date of Evaluation: 07/19/22 Chief Complaint:   Diagnosis:  Final diagnoses:  Anxiety disorder, unspecified type   History of Present illness: Cody Weaver is a 26 y.o. male. Pt presents voluntarily to [facility] behavioral health for walk-in assessment.  Pt is accompanied by his mother, Cody Weaver. Pt first seen alone. Cody Weaver joins assessment  with pt verbal consent. Pt is assessed face-to-face by nurse practitioner.   Cody Weaver, 26 y.o., male patient seen face to face by this provider, and chart reviewed on 07/19/22.  On evaluation Cody Weaver reports he is presenting today due to "severe anxiety which sucks". He states "even taking a phone call, it's hard to just do it, being in large crowds, I feel like I get a lot of stares, when I hear another human laugh, I wonder if it's about me". He states he was at [employer] once, and he felt like he was in a movie, was walking through a crowd and felt that everyone was stopping to stare at him. He reports he is currently feeling anxious and cannot meet eye contact with Clinical research associate. He makes minimal to no eye contact. He states he does enjoy social interactions and likes talking to people and being outside but is anxious when he does due to concerns people are staring at him. He believes anxiety started in middle school, worsened in high school.  He denies SI/VI\/HI, AVH, paranoia, thought insertion, thought broadcasting.   He denies history of NSSI, inpatient psychiatric hospitalization, or SA.   Pt reports his current mood is anxious. He reports good appetite, eating 3 meals/day, and fair sleep, sleeping 7 hours/night.   He denies alcohol, marijuana, crack/cocaine, other substance use.   He reports he is living with his mother, and little brother who is 20 years old.   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    He reports family psychiatric history is positive for substance abuse (uncles, aunts, cousin).  He denies access to a firearm or other weapon.   He is not currently connected with medication management or counseling.  Per Cody Weaver, she is concerned, as "there are months and months of being in the house. He is not going out. He is not interested in being social with people". She denies concerns that he is a danger to himself or others. She does feel comfortable with discharge today.  Per chart review, pt has a cancelled outpatient visit on 07/03/22. Pt and Cody Weaver asked about this. Per Cody Weaver, attempted walk in at [facility] and was unable to stay to be seen.  Discussed recommendation for outpatient medication management and counseling. Discussed walk in hours at [facility] Pt can be seen as early as tomorrow as a walk in to establish services. Upon discharge, pt and Cody Weaver were shown where to go to establish services. Pt verbally contracts to safety. Cody Weaver denies safety concerns with discharge and states she will be supporting pt to establish services tomorrow.   During evaluation Cody Weaver is a&ox3, sitting, in no acute distress. He is calm, cooperative and attentive.  His mood is anxious with flat affect. He makes minimal to no eye contact. There is no evidence of agitation, aggression or distractibility. He does not appear to be responding to internal stimuli. Patient is able to converse coherently, goal directed thoughts, no distractibility, or pre-occupation.  He denies SI/VI/HI, AVH,  paranoia, thought insertion, thought broadcasting.  Patient answered question appropriately.     Psychiatric Specialty Exam  Presentation  General Appearance:Appropriate for Environment; Casual; Fairly Groomed  Eye Contact:Minimal; None  Speech:Clear and Coherent; Slow  Speech Volume:Decreased  Handedness:No data recorded  Mood and Affect   Mood:Anxious  Affect:Flat   Thought Process  Thought Processes:Coherent; Goal Directed; Linear  Descriptions of Associations:Intact  Orientation:Full (Time, Place and Person)  Thought Content:Logical    Hallucinations:None  Ideas of Reference:None  Suicidal Thoughts:No  Homicidal Thoughts:No   Sensorium  Memory:Immediate Fair  Judgment:Fair  Insight:Fair   Executive Functions  Concentration:Fair  Attention Span:Fair  [place]   Psychomotor Activity  Psychomotor Activity:Normal   Assets  Assets:Desire for Improvement; Armed forces logistics/support/administrative officer; Financial Resources/Insurance; Housing; Social Support; Vocational/Educational   Sleep  Sleep:Fair  Number of hours: 0 (7 hours/night)   No data recorded  Physical Exam: Physical Exam Cardiovascular:     Rate and Rhythm: Normal rate.  Pulmonary:     Effort: Pulmonary effort is normal.  Neurological:     Mental Status: He is alert and oriented to person, place, and time.  Psychiatric:        Attention and Perception: Attention and perception normal.        Mood and Affect: Mood is anxious. Affect is flat.        Speech: Speech normal.        Behavior: Behavior normal. Behavior is cooperative.        Thought Content: Thought content normal.        Cognition and Memory: Cognition and memory normal.        Judgment: Judgment normal.    Review of Systems  Constitutional:  Negative for chills and fever.  Respiratory:  Negative for shortness of breath.   Cardiovascular:  Negative for chest pain and palpitations.  Gastrointestinal:  Negative for abdominal pain.  Neurological:  Negative for headaches.  Psychiatric/Behavioral:  The patient is nervous/anxious.    Blood pressure 130/87, pulse 88, temperature 98 F (36.7 C), temperature source Oral, resp. rate 18, SpO2 100 %. There is no height or weight on file to calculate BMI.  Musculoskeletal: Strength & Muscle  Tone: within normal limits Gait & Station: normal Patient leans: N/A   [place] Discharge Disposition for Follow up and Recommendations: Based on my evaluation the patient does not appear to have an emergency medical condition and can be discharged with resources and follow up care in outpatient services for Medication Management and Individual Therapy   Tharon Aquas, NP 07/19/2022, 1:39 PM

## 2022-07-19 NOTE — BH Assessment (Addendum)
Cody Weaver, Routine; 26 year old presents this date with his mother Keisuke Hollabaugh, [phone].  Pt  denies SI, HI, AVH/and Drug/Alcohol use.  Pt complaints of anxiety, panic attacks, and heart pounds.  Pt reports "I cannot leave my house".  Pt admits to prior MH diagnosis or prescribed medication for symptom management.  MSE signed by patient.

## 2022-07-19 NOTE — Discharge Instructions (Addendum)
Please come to [facility] (this facility, SECOND FLOOR) during walk in hours for appointment with psychiatrist/provider for further medication management and for therapists for therapy.   Therapy walk in hours are Monday through Thursday 8AM until slots are full. Every Friday 1PM to 4PM.  Psychiatry/medical management walk in hours are Monday through Friday 8AM-11AM.  Walk ins are seen on a first come, first served basis. For all walk-ins, we ask that you arrive by 7:00AM, because patients will be seen in order of arrival. Availability is limited, and therefore you may not be seen on the same day that you walk in. When you arrive please go upstairs for your appointment. If you are unsure of where to go, inform the front desk that you are here for a walk in appointment and they will assist you with directions upstairs.  Address:  [address], in [place], [place] Ph: [phone]

## 2022-07-20 ENCOUNTER — Ambulatory Visit (INDEPENDENT_AMBULATORY_CARE_PROVIDER_SITE_OTHER): Payer: No Payment, Other | Admitting: Physician Assistant

## 2022-07-20 ENCOUNTER — Telehealth (HOSPITAL_COMMUNITY): Payer: Self-pay | Admitting: Family Medicine

## 2022-07-20 ENCOUNTER — Encounter (HOSPITAL_COMMUNITY): Payer: Self-pay | Admitting: Physician Assistant

## 2022-07-20 VITALS — BP 133/85 | HR 83 | Ht 72.0 in | Wt 284.0 lb

## 2022-07-20 DIAGNOSIS — F411 Generalized anxiety disorder: Secondary | ICD-10-CM | POA: Diagnosis not present

## 2022-07-20 DIAGNOSIS — F4010 Social phobia, unspecified: Secondary | ICD-10-CM

## 2022-07-20 MED ORDER — HYDROXYZINE HCL 10 MG PO TABS: 10.0000 mg | ORAL_TABLET | Freq: Three times a day (TID) | ORAL | 1 refills | Status: DC | PRN

## 2022-07-20 MED ORDER — ESCITALOPRAM OXALATE 10 MG PO TABS: 10.0000 mg | ORAL_TABLET | Freq: Every day | ORAL | 1 refills | Status: DC

## 2022-07-20 NOTE — BH Assessment (Signed)
Care Management - [facility] Follow Up Discharges  ° °Writer attempted to make contact with patient today and was unsuccessful.  Writer left a HIPPA compliant voice message.  ° °Per chart review, patient was provided with outpatient resources. ° °

## 2022-07-20 NOTE — Progress Notes (Signed)
Psychiatric Initial Adult Assessment   Patient Identification: Cody Weaver MRN:  [id number] Date of Evaluation:  07/20/2022 Referral Source: Referred by [facility] Chief Complaint:   Chief Complaint  Patient presents with   Medication Management   Visit Diagnosis:    ICD-10-CM   1. Generalized anxiety disorder  F41.1 escitalopram (LEXAPRO) 10 MG tablet    hydrOXYzine (ATARAX) 10 MG tablet    2. Social anxiety disorder  F40.10 escitalopram (LEXAPRO) 10 MG tablet      History of Present Illness:    Cody Weaver "Cody Weaver" with no documented past psychiatric history who presents to [facility] to establish psychiatric care and for medication management.  Patient reports that he has been dealing with severe anxiety to the point of being unable to make eye contact.  In addition to his severe anxiety, patient states that he is afraid to even step flat outside of his house.  Whenever patient is outside of his home, he feels that people are staring at him.  Patient's anxiety has distracted him from family gatherings and being able to function at work.  Patient describes the feeling as being a piece that does not fit around other people.  Patient reports that he has been dealing with severe anxiety since the ninth grade.  Patient reports that he had to be thoroughly dragged out of his house in order to make this encounter.  Patient states that his anxiety is often accompanied by racing thoughts.  Patient endorses panic attacks and states that his heart rate will elevate until he hears nothing by his chest beating.  Patient denies any alleviating factors to his anxiety.  Patient denies any depressive symptoms.  Patient denies past history of hospitalization due to mental health.  He further denies past history of suicide attempt and denies ever engaging in self-harm.  A GAD-7 screen was performed with the patient  scoring a 16.  Shortly after the encounter with the patient, writer was able to receive collateral from the patient's mother.  Per patient's mother, patient has not been able to leave the house for months due to his anxiety.  She states that whenever he takes out the trash, he will take the trash out late at night to avoid seeing people.  She reports that whenever the patient is at home, he will stay in his room for long periods of time.  She states that the patient does not feel comfortable with talking to people and has been unable to focus in school in the past due to the perceived notion that people were looking at him.  Patient is alert and oriented x 4, calm, cooperative, and fully engaged in contact.  Patient provides some eye contact during the encounter and occasionally pauses to look out the window.  Patient denies suicidal or homicidal ideations.  He further denies auditory or visual hallucinations and does not appear to be responding to internal/external stimuli.  Patient does endorse some paranoia stating that he will pick out the blinds because he thinks that people are staring at him.  He states that when he was young he used to be fearful of being kidnapped.  Patient endorses okay sleep and receives on average 7 hours of sleep each night.  Patient endorses fluctuating appetite.  Patient denies alcohol consumption, tobacco use, and illicit drug use.  Associated Signs/Symptoms: Depression Symptoms:  anhedonia, psychomotor agitation, psychomotor retardation, difficulty concentrating, anxiety, panic attacks, disturbed sleep, weight  loss, (Hypo) Manic Symptoms:  Distractibility, Flight of Ideas, Irritable Mood, Anxiety Symptoms:  Agoraphobia, Excessive Worry, Panic Symptoms, Social Anxiety, Specific Phobias, Psychotic Symptoms:  Paranoia, PTSD Symptoms: Had a traumatic exposure:  N/A Had a traumatic exposure in the last month:  N/A Re-experiencing:  Flashbacks Intrusive  Thoughts Nightmares Hypervigilance:  Yes Hyperarousal:  Emotional Numbness/Detachment Irritability/Anger None Avoidance:  Decreased Interest/Participation Foreshortened Future  Past Psychiatric History:  Patient denies a past history of psychiatric illness  Previous Psychotropic Medications: No   Substance Abuse History in the last 12 months:  No.  Consequences of Substance Abuse: NA  Past Medical History:  Past Medical History:  Diagnosis Date   Obesity    History reviewed. No pertinent surgical history.  Family Psychiatric History:  Patient denies a family history of psychiatric illness  Family History: History reviewed. No pertinent family history.  Social History:   Social History   Socioeconomic History   Marital status: Single    Spouse name: Not on file   Number of children: Not on file   Years of education: Not on file   Highest education level: Not on file  Occupational History   Not on file  Tobacco Use   Smoking status: Never   Smokeless tobacco: Never  Substance and Sexual Activity   Alcohol use: Never   Drug use: Never   Sexual activity: Not on file  Other Topics Concern   Not on file  Social History Narrative   Not on file   Social Determinants of Health   Financial Resource Strain: Not on file  Food Insecurity: Not on file  Transportation Needs: Not on file  Physical Activity: Not on file  Stress: Not on file  Social Connections: Not on file    Additional Social History:  Patient is not currently  Allergies:  No Known Allergies  Metabolic Disorder Labs: No results found for: "HGBA1C", "MPG" No results found for: "PROLACTIN" No results found for: "CHOL", "TRIG", "HDL", "CHOLHDL", "VLDL", "LDLCALC" No results found for: "TSH"  Therapeutic Level Labs: No results found for: "LITHIUM" No results found for: "CBMZ" No results found for: "VALPROATE"  Current Medications: Current Outpatient Medications  Medication Sig Dispense  Refill   escitalopram (LEXAPRO) 10 MG tablet Take 1 tablet (10 mg total) by mouth daily. 30 tablet 1   hydrOXYzine (ATARAX) 10 MG tablet Take 1 tablet (10 mg total) by mouth 3 (three) times daily as needed. 75 tablet 1   clindamycin (CLINDAGEL) 1 % gel SMARTSIG:1 Sparingly Topical Twice Daily     dicyclomine (BENTYL) 20 MG tablet Take 1 tablet (20 mg total) by mouth 2 (two) times daily. 20 tablet 0   fluconazole (DIFLUCAN) 200 MG tablet Take 200 mg by mouth daily.     ketoconazole (NIZORAL) 2 % shampoo Apply topically.     omeprazole (PRILOSEC) 20 MG capsule Take 1 capsule (20 mg total) by mouth daily. 30 capsule 1   Vitamin D, Ergocalciferol, (DRISDOL) 1.25 MG (50000 UNIT) CAPS capsule Take 50,000 Units by mouth once a week.     No current facility-administered medications for this visit.    Musculoskeletal: Strength & Muscle Tone: within normal limits Gait & Station: normal Patient leans: N/A  Psychiatric Specialty Exam: Review of Systems  Psychiatric/Behavioral:  Negative for decreased concentration, dysphoric mood, hallucinations, self-injury, sleep disturbance and suicidal ideas. The patient is nervous/anxious. The patient is not hyperactive.     Blood pressure 133/85, pulse 83, height 6' (1.829 m), weight 284 lb (128.8  kg), SpO2 100 %.Body mass index is 38.52 kg/m.  General Appearance: Casual and Well Groomed  Eye Contact:  Good  Speech:  Clear and Coherent and Normal Rate  Volume:  Normal  Mood:  Anxious  Affect:  Appropriate  Thought Process:  Coherent, Goal Directed, and Descriptions of Associations: Intact  Orientation:  Full (Time, Place, and Person)  Thought Content:  WDL  Suicidal Thoughts:  No  Homicidal Thoughts:  No  Memory:  Immediate;   Good Recent;   Good Remote;   Good  Judgement:  Good  Insight:  Fair  Psychomotor Activity:  Normal  Concentration:  Concentration: Good and Attention Span: Fair  Recall:  Good  Fund of Knowledge:Good  Language: Good   Akathisia:  No  Handed:  Right  AIMS (if indicated):  not done  Assets:  Communication Skills Desire for Improvement Housing Social Support Vocational/Educational  ADL's:  Intact  Cognition: WNL  Sleep:  Good   Screenings: GAD-7    Loss adjuster, charteredlowsheet Row [facility]  Total GAD-7 Score 16      PHQ2-9    Flowsheet Row [facility]  PHQ-2 Total Score 1      Flowsheet Row [facility]  C-SSRS RISK CATEGORY No Risk No Risk No Risk       Assessment and Plan:   Ronak A. Stoffers "Cody BuffMarc" with no documented past psychiatric history who [facility] establish psychiatric care and for medication management.  Patient presents to the clinic dealing with overwhelming anxiety.  In addition to anxiety, patient feels that people are staring at him on a regular basis.  Patient's anxiety has prevented him from being able to live a productive life.  Provider recommended patient be placed on escitalopram 10 mg daily for the management of his generalized anxiety disorder.  Patient was also recommended hydroxyzine 10 mg 3 times daily as needed for the management of his anxiety.  Patient was agreeable to recommendation.  Patient's medication to be prescribed to pharmacy of choice.  Patient to also be placed with a licensed clinical social worker at this facility.  Collaboration of Care: Medication Management AEB provider managing patient's psychiatric medication and Psychiatrist AEB patient being followed by a mental health provider  Patient/Guardian was advised Release of Information must be obtained prior to any record release in order to  collaborate their care with an outside provider. Patient/Guardian was advised if they have not already done so to contact the registration department to sign all necessary forms in order for us to release information regarding their care.   Consent: Patient/Guardian gives verbal consent for treatment and assignment of benefits for services provided during this visit. Patient/Guardian expressed understanding and agreed to proceed.   1. Generalized anxiety disorder  - escitalopram (LEXAPRO) 10 MG tablet; Take 1 tablet (10 mg total) by mouth daily.  Dispense: 30 tablet; Refill: 1 - hydrOXYzine (ATARAX) 10 MG tablet; Take 1 tablet (10 mg total) by mouth 3 (three) times daily as needed.  Dispense: 75 tablet; Refill: 1  2. Social anxiety disorder  - escitalopram (LEXAPRO) 10 MG tablet; Take 1 tablet (10 mg total) by mouth daily.  Dispense: 30 tablet; Refill: 1  Patient to follow-up in  6 weeks Provider spent a total of 35 minutes with the patient/reviewing patient's chart  Meta Hatchet, PA 9/22/20239:32 PM

## 2022-08-29 ENCOUNTER — Encounter (HOSPITAL_COMMUNITY): Payer: Self-pay

## 2022-08-29 ENCOUNTER — Ambulatory Visit (INDEPENDENT_AMBULATORY_CARE_PROVIDER_SITE_OTHER): Payer: No Payment, Other | Admitting: Licensed Clinical Social Worker

## 2022-08-29 DIAGNOSIS — F4000 Agoraphobia, unspecified: Secondary | ICD-10-CM | POA: Diagnosis not present

## 2022-08-29 DIAGNOSIS — F502 Bulimia nervosa: Secondary | ICD-10-CM

## 2022-08-29 DIAGNOSIS — F411 Generalized anxiety disorder: Secondary | ICD-10-CM

## 2022-08-29 NOTE — Progress Notes (Signed)
Comprehensive Clinical Assessment (CCA) Note  08/29/2022 Cody Weaver [id number]  Chief Complaint:  Chief Complaint  Patient presents with   Anxiety    Agoraphobia    Eating Disorder    Purging 2 x weekly    Visit Diagnosis: Agoraphobia and bulimia nervosa  Client is a 26 year old male. Client is referred by medication provider for a anxiety.   Client states mental health symptoms as evidenced by:   Depression Worthlessness; Hopelessness; Increase/decrease in appetite; Difficulty Concentrating; Weight gain/loss Worthlessness; Hopelessness; Increase/decrease in appetite; Difficulty Concentrating; Weight gain/loss  Duration of Depressive Symptoms Greater than two weeks Greater than two weeks  Mania None None  Anxiety Tension; Worrying Tension; Worrying  Psychosis None None  Trauma None None  Obsessions None None  Compulsions None None  Inattention None None      Oppositional/Defiant Behaviors None None  Emotional Irregularity Chronic feelings of emptiness; Unstable self-image Chronic feelings of emptiness; Unstable self-image    Client denies suicidal and homicidal ideations at this time  Client denies hallucinations and delusions at this time   Client was screened for the following SDOH: Financials, exercise, stress\tension, social interaction, and depression  Assessment Information that integrates subjective and objective details with a therapist's professional interpretation:    Patient was alert and oriented x5.  Patient was pleasant, cooperative, and avoided eye contact.  He presented today with depressed, anxious, and flat mood\affect.  He engaged well in therapy session was dressed casually.  Patient comes in today as a referral from medication provider.  Patient reports stress and tension for fear of leaving the house and big crowds.  Patient reports that he has symptoms for panic attacks, palpitations, tension,, worry, hopelessness, worthlessness, and poor body  image.  Patient reports purging behaviors 2 times weekly.  Patient reports poor self-esteem with weight and patchy hair.  He reports that he has not had a male role model in his home which has attributed to his poor self-esteem for learning things such as shaving, finances, future goals.  Patient reports primary support systems as his mother and brother.  Patient reports not having a job in the last year.  Patient reports wanting to work on self-esteem, future goals, and body image issues.  Plan for patient is to be seen at least 2 times monthly by LCSW.  Client meets criteria for: Agoraphobia and bulimia nervosa Client states use of the following substances: None reported     Clinician assisted client with scheduling the following appointments: November 21 at 9 AM. Clinician details of appointment.    Client was in agreement with treatment recommendations.   CCA Screening, Triage and Referral (STR)  Patient Reported Information How did you hear about [place]? Family/Friend   What Is the Reason for Your Visit/Call Today? Anxiety Pt denies SI, HI, AVH/and Drug/Alcohol use. Pt complaints of anxiety, panic attacks, and heart pounds. Pt reports "I cannot leave my house". Pt admits to prior MH diagnosis or prescribed medication for symptom management. MSE signed by patient.  How Long Has This Been Causing You Problems? 1 wk - 1 month  What Do You Feel Would Help You the Most Today? Treatment for Depression or other mood problem; Social Support   Have You Recently Been in Any Inpatient Treatment (Hospital/Detox/Crisis Center/28-Day Program)? No   Have You Ever Received Services From [employer] Before? Yes  Who Do You [facility] at [facility]   Have You Recently Had Any Thoughts About Hurting Yourself? No  Are You Planning to Commit Suicide/Harm Yourself At This time? No   Have you Recently Had Thoughts About Hurting Someone Karolee Ohs? No  Explanation: No data  recorded  Have You Used Any Alcohol or Drugs in the Past 24 Hours? No   Do You Currently Have a Therapist/Psychiatrist? No   Have You Been Recently Discharged From Any Office Practice or Programs? No     CCA Screening Triage Referral Assessment Type of Contact: Face-to-Face  Is CPS involved or ever been involved? Never  Is APS involved or ever been involved? Never   Patient Determined To Be At Risk for Harm To Self or Others Based on Review of Patient Reported Information or Presenting Complaint? No    [facility] of Assessment[facility]   Does Patient Present under Involuntary Commitment? No   [place] of Residence: [place]    Determination of Need: Routine (7 days)   Options For Referral: Medication Management; Outpatient Therapy     CCA Biopsychosocial Intake/Chief Complaint:  Pt reports social anxiety for leaving the house. He states panic and fear of big crowds. Pt reports body image issues with his weight and folliculitis.  Current Symptoms/Problems: tension, worry, purging food 2 x weekly,   Patient Reported Schizophrenia/Schizoaffective Diagnosis in Past: No   Strengths: willing to engage in treatment  Preferences: therapy and medication mgnt  Abilities: No data recorded  Type of Services Patient Feels are Needed: therapy   Initial Clinical Notes/Concerns: purging 2 x weekly   Mental Health Symptoms Depression:   Worthlessness; Hopelessness; Increase/decrease in appetite; Difficulty Concentrating; Weight gain/loss   Duration of Depressive symptoms:  Greater than two weeks   Mania:   None   Anxiety:    Tension; Worrying   Psychosis:   None   Duration of Psychotic symptoms: No data recorded  Trauma:   None   Obsessions:   None   Compulsions:   None   Inattention:   None   Hyperactivity/Impulsivity:  No data recorded  Oppositional/Defiant Behaviors:   None   Emotional Irregularity:   Chronic feelings  of emptiness; Unstable self-image   Other Mood/Personality Symptoms:  No data recorded   Mental Status Exam Appearance and self-care  Stature:   Average   Weight:   Overweight   Clothing:   Casual   Grooming:   Normal   Cosmetic use:  No data recorded  Posture/gait:   Normal   Motor activity:   Not Remarkable   Sensorium  Attention:   Normal   Concentration:   Normal   Orientation:   X5   Recall/memory:   Normal   Affect and Mood  Affect:   Anxious; Depressed   Mood:   Depressed; Anxious; Dysphoric   Relating  Eye contact:   Normal   Facial expression:   Depressed   Attitude toward examiner:   Cooperative   Thought and Language  Speech flow:  Clear and Coherent   Thought content:   Appropriate to Mood and Circumstances   Preoccupation:   None   Hallucinations:   None   Organization:  No data recorded  [employer] of Knowledge:   Fair   Intelligence:   Above Average   Abstraction:   Functional   Judgement:   Fair   Reality Testing:   Adequate   Insight:   Fair   Decision Making:   Normal   Social Functioning  Social Maturity:   Isolates   Social Judgement:   Normal  Stress  Stressors:   Illness; Work; Museum/gallery curator; Other (Comment) (eating disorder for bulimia nervosa)   Coping Ability:   Exhausted; Overwhelmed   Skill Deficits:  No data recorded  Supports:   Family; Friends/Service system     Religion: Religion/Spirituality Are You A Religious Person?: Yes  Leisure/Recreation: Leisure / Recreation Do You Have Hobbies?: Yes Leisure and Hobbies: playing video games, watching nba,  Exercise/Diet: Exercise/Diet Do You Exercise?: Yes What Type of Exercise Do You Do?: Bike How Many Times a Week Do You Exercise?: 1-3 times a week Have You Gained or Lost A Significant Amount of Weight in the Past Six Months?: Yes-Gained Number of Pounds Gained: 10 Do You Follow a Special Diet?: No Do You  Have Any Trouble Sleeping?: Yes Explanation of Sleeping Difficulties: sleep is up and down sometimes too much and sometimes too little   CCA Employment/Education Employment/Work Situation: Employment / Work Situation Employment Situation: Unemployed Patient's Job has Been Impacted by Current Illness: Yes Describe how Patient's Job has Been Impacted: social anxiety What is the Longest Time Patient has Held a Job?: 6 months Where was the Patient Employed at that Time?: [employer] Has Patient ever Been in the [employer]?: No  Education: Education Is Patient Currently Attending School?: No Last Grade Completed: 12 Did Teacher, adult education From [employer]?: Yes Did Physicist, medical?: No Did You Attend Graduate School?: No Did You Have An Individualized Education Program (IIEP): No Did You Have Any Difficulty At School?: No Patient's Education Has Been Impacted by Current Illness: No   CCA Family/Childhood History Family and Relationship History: Family history Marital status: Single Are you sexually active?: No What is your sexual orientation?: hetrosexual Has your sexual activity been affected by drugs, alcohol, medication, or emotional stress?: none reported Does patient have children?: No  Childhood History:  Childhood History By whom was/is the patient raised?: Mother Description of patient's relationship with caregiver when they were a child: Pt reports a lack of prepertation for adulthood due to lack of education in the household. Pt reports it would have helped to to have a male remodel in his life Patient's description of current relationship with people who raised him/her: Pt reports he still lives with his mother and relationship is better Does patient have siblings?: Yes Number of Siblings: 1 Description of patient's current relationship with siblings: good Did patient suffer any verbal/emotional/physical/sexual abuse as a child?: No Did patient suffer from  severe childhood neglect?: No Has patient ever been sexually abused/assaulted/raped as an adolescent or adult?: No Was the patient ever a victim of a crime or a disaster?: No Witnessed domestic violence?: No Has patient been affected by domestic violence as an adult?: No  Child/Adolescent Assessment:     CCA Substance Use Alcohol/Drug Use: Alcohol / Drug Use History of alcohol / drug use?: No history of alcohol / drug abuse    DSM5 Diagnoses: Patient Active Problem List   Diagnosis Date Noted   Agoraphobia 08/29/2022   Bulimia nervosa 08/29/2022   GAD (generalized anxiety disorder) 07/20/2022     Referrals to Alternative Service(s): Referred to Alternative Service(s):   Place:   Date:   Time:    Referred to Alternative Service(s):   Place:   Date:   Time:    Referred to Alternative Service(s):   Place:   Date:   Time:    Referred to Alternative Service(s):   Place:   Date:   Time:      Collaboration of Care:  Other None today.   Patient/Guardian was advised Release of Information must be obtained prior to any record release in order to collaborate their care with an outside provider. Patient/Guardian was advised if they have not already done so to contact the registration department to sign all necessary forms in order for [place] to release information regarding their care.   Consent: Patient/Guardian gives verbal consent for treatment and assignment of benefits for services provided during this visit. Patient/Guardian expressed understanding and agreed to proceed.   [name], LCSW

## 2022-08-29 NOTE — Plan of Care (Signed)
Pt agreeable to plan  ?

## 2022-09-04 ENCOUNTER — Encounter (HOSPITAL_COMMUNITY): Payer: Self-pay

## 2022-09-04 ENCOUNTER — Encounter (HOSPITAL_COMMUNITY): Payer: Self-pay | Admitting: Student in an Organized Health Care Education/Training Program

## 2022-09-13 ENCOUNTER — Encounter (HOSPITAL_COMMUNITY): Payer: Self-pay | Admitting: Student in an Organized Health Care Education/Training Program

## 2022-09-13 ENCOUNTER — Telehealth (INDEPENDENT_AMBULATORY_CARE_PROVIDER_SITE_OTHER): Payer: No Payment, Other | Admitting: Student in an Organized Health Care Education/Training Program

## 2022-09-13 DIAGNOSIS — F411 Generalized anxiety disorder: Secondary | ICD-10-CM | POA: Diagnosis not present

## 2022-09-13 DIAGNOSIS — F4010 Social phobia, unspecified: Secondary | ICD-10-CM

## 2022-09-13 MED ORDER — ESCITALOPRAM OXALATE 10 MG PO TABS
10.0000 mg | ORAL_TABLET | Freq: Every day | ORAL | 3 refills | Status: DC
Start: 2022-09-13 — End: 2022-11-02

## 2022-09-13 MED ORDER — HYDROXYZINE HCL 10 MG PO TABS: 10.0000 mg | ORAL_TABLET | Freq: Three times a day (TID) | ORAL | 3 refills | Status: DC | PRN

## 2022-09-13 NOTE — Progress Notes (Signed)
BH MD/PA/NP OP Progress Note  09/13/2022 4:33 PM Cody Weaver  MRN:  [id number]  Chief Complaint:  Chief Complaint  Patient presents with   Follow-up  Virtual Visit via Video Note  I connected with Cody Weaver on 09/13/22 at 10:30 AM EST by a video enabled telemedicine application and verified that I am speaking with the correct person using two identifiers.  Location: Patient: Home Provider: Office   I discussed the limitations of evaluation and management by telemedicine and the availability of in person appointments. The patient expressed understanding and agreed to proceed.      I discussed the assessment and treatment plan with the patient. The patient was provided an opportunity to ask questions and all were answered. The patient agreed with the plan and demonstrated an understanding of the instructions.   The patient was advised to call back or seek an in-person evaluation if the symptoms worsen or if the condition fails to improve as anticipated.  I provided 30 minutes of non-face-to-face time during this encounter.   Bobbye MortonJai B Dacian Orrico, MD  HPI: Cody SheererMarcquaz Weaver is a 26 yo patient w/ PPH of GAD, Social anxiety, and agoraphobia. Patient was previously prescribed the following regimen:  Lexapro 10mg  daily Hydroxyzine 10mg  TID PRN  On assessment today patient reports that he has not been taking the Lexapro daily, because he had read some things online that made him reluctant. Patient reports that he took it on days when he knew he would need to "go out and do things" and that he had found it helpful, but he also "did not feel like myself." Patient reports that [facility] yesterday for the first time for his birthday and he enjoyed the experience. Patient reports he was not worried about everybody else looking at him or judging him like he is normally. Patient reports he was also able to handle the appropriate attention placed on him  for his bday meal, very well and was thankful for all of his gifts and phone calls. Patient reports that in general when he takes the medication he does not feel "paranoid" in crowds.  Patient reports that he thinks he has an acceptable sleep regimen for a self proclaimed "procastinator" in college. Patietn reports that he has not had any SI, HI, or AVH. Patient reports that he is conscious of his isolating behaviors and is insightful that he is nervous to ask for help.   Patient reports that in regards to his eating he has stopped purging in the last 2-3 weeks since seeing his therapist and receivng education on the matter. Patient endorses that he has been uncomfortable with his appearance and had been told that weight loss may help with some of his problems. Patient reports that he took an "unhealthy" approach and became guilty when he ate "dumb foods" like carbs. Patient reports he would also at time binge food. Patient reports he was inducing vomiting when he felt guilty about eating the foods.   Patient and provider discuss that healthy weight loss is ok, but patient will need a heathy relationship with food to do so and that he could consider a referral to a nutritionist or dietician to help him, but patient refused at this time. Patient endorsed that at this time he wants to try on his own and endorsed that he has started reaching out and seeing providers about his skin, and thus far he has not been very happy with the  results, so he will limit his interactions with providers for now.  Visit Diagnosis:    ICD-10-CM   1. Generalized anxiety disorder  F41.1 escitalopram (LEXAPRO) 10 MG tablet    hydrOXYzine (ATARAX) 10 MG tablet    2. Social anxiety disorder  F40.10 escitalopram (LEXAPRO) 10 MG tablet      Past Psychiatric History: None prior 07/20/2022 Dx: GAD, Social anxiety, Agoraphobia  Last Visit: 06/2022: Started on Lexapro 10mg  daily and Hydroxyzine 10mg  TID PRN  Past Medical  History:  Past Medical History:  Diagnosis Date   Obesity    No past surgical history on file.  Family Psychiatric History: Denies  Family History: No family history on file.  Social History:  Social History   Socioeconomic History   Marital status: Single    Spouse name: Not on file   Number of children: Not on file   Years of education: Not on file   Highest education level: Not on file  Occupational History   Not on file  Tobacco Use   Smoking status: Never   Smokeless tobacco: Never  Substance and Sexual Activity   Alcohol use: Never   Drug use: Never   Sexual activity: Not on file  Other Topics Concern   Not on file  Social History Narrative   Not on file   Social Determinants of Health   Financial Resource Strain: High Risk (08/29/2022)   Overall Financial Resource Strain (CARDIA)    Difficulty of Paying Living Expenses: Hard  Food Insecurity: No Food Insecurity (08/29/2022)   Hunger Vital Sign    Worried About Running Out of Food in the Last Year: Never true    Ran Out of Food in the Last Year: Never true  Transportation Needs: No Transportation Needs (08/29/2022)   PRAPARE - 13/10/2021 (Medical): No    Lack of Transportation (Non-Medical): No  Physical Activity: Insufficiently Active (08/29/2022)   Exercise Vital Sign    Days of Exercise per Week: 3 days    Minutes of Exercise per Session: 30 min  Stress: Stress Concern Present (08/29/2022)   13/10/2021 of Occupational Health - Occupational Stress Questionnaire    Feeling of Stress : Very much  Social Connections: Socially Isolated (08/29/2022)   Social Connection and Isolation Panel [NHANES]    Frequency of Communication with Friends and Family: More than three times a week    Frequency of Social Gatherings with Friends and Family: Twice a week    Attends Religious Services: Never    [employer] or Organizations: No    Attends 13/10/2021:  Never    Marital Status: Never married    Allergies: No Known Allergies  Metabolic Disorder Labs: No results found for: "HGBA1C", "MPG" No results found for: "PROLACTIN" No results found for: "CHOL", "TRIG", "HDL", "CHOLHDL", "VLDL", "LDLCALC" No results found for: "TSH"  Therapeutic Level Labs: No results found for: "LITHIUM" No results found for: "VALPROATE" No results found for: "CBMZ"  Current Medications: Current Outpatient Medications  Medication Sig Dispense Refill   clindamycin (CLINDAGEL) 1 % gel SMARTSIG:1 Sparingly Topical Twice Daily     dicyclomine (BENTYL) 20 MG tablet Take 1 tablet (20 mg total) by mouth 2 (two) times daily. 20 tablet 0   escitalopram (LEXAPRO) 10 MG tablet Take 1 tablet (10 mg total) by mouth daily. 30 tablet 3   fluconazole (DIFLUCAN) 200 MG tablet Take 200 mg by mouth daily.  hydrOXYzine (ATARAX) 10 MG tablet Take 1 tablet (10 mg total) by mouth 3 (three) times daily as needed. 90 tablet 3   ketoconazole (NIZORAL) 2 % shampoo Apply topically.     omeprazole (PRILOSEC) 20 MG capsule Take 1 capsule (20 mg total) by mouth daily. 30 capsule 1   Vitamin D, Ergocalciferol, (DRISDOL) 1.25 MG (50000 UNIT) CAPS capsule Take 50,000 Units by mouth once a week.     No current facility-administered medications for this visit.     Musculoskeletal: Defer  Psychiatric Specialty Exam: Review of Systems  Psychiatric/Behavioral:  Positive for dysphoric mood. Negative for suicidal ideas. The patient is not nervous/anxious.     There were no vitals taken for this visit.There is no height or weight on file to calculate BMI.  General Appearance: Casual  Eye Contact:  Good  Speech:  Clear and Coherent  Volume:  Normal  Mood:  Euthymic  Affect:  Appropriate  Thought Process:  Coherent  Orientation:  Full (Time, Place, and Person)  Thought Content: Logical   Suicidal Thoughts:  No  Homicidal Thoughts:  No  Memory:  Immediate;   Good Recent;   Fair   Judgement:  Other:  Improving, but still not Fair  Insight:  Shallow  Psychomotor Activity:  Normal  Concentration:  Concentration: Fair  Recall:  NA  Fund of Knowledge: Good  Language: Good  Akathisia:  NA  Handed:    AIMS (if indicated): not done  Assets:  Communication Skills Desire for Improvement Housing Resilience Social Support Vocational/Educational  ADL's:  Intact  Cognition: WNL  Sleep:  Fair   Screenings: GAD-7    Advertising copywriter from 08/29/2022 in [facility] Office Visit from 07/20/2022 in [facility]  Total GAD-7 Score 12 16      PHQ2-9    Flowsheet Row Counselor from 08/29/2022 in [facility] Office Visit from 07/20/2022 in [facility]  PHQ-2 Total Score 2 1  PHQ-9 Total Score 10 --      Flowsheet Row Counselor from 08/29/2022 in [facility] Office Visit from 07/20/2022 in [facility] ED from 03/14/2022 in [facility] EMERGENCY DEPARTMENT  C-SSRS RISK CATEGORY No Risk No Risk No Risk        Assessment and Plan: Wister Hoefle is a 26 yo patient w/ PPH of GAD, Social anxiety, and agoraphobia. Based on assessment patient appears to have strongly benefited from [facility], but was not taking the medication consistently which could contribute to the strange feelings patient was having. Patient also continues to struggle with low-self esteem which contributes to his social anxiety, agoraphobia, and bulimia nervosa; however the medication and therapy are beneficial. Will continue to talk to patient about nutritionist or dietician referral at upcoming appts.    Social Anxiety Bulimia nervosa GAD w/ agoraphobia - Continue Lexapro 10mg  DAILY - Continue Hydroxzine 10mg  TID PRN  F/u in 2 mon  Collaboration of Care: Collaboration of Care:   Patient/Guardian was advised  Release of Information must be obtained prior to any record release in order to collaborate their care with an outside provider. Patient/Guardian was advised if they have not already done so to contact the registration department to sign all necessary forms in order for to release information regarding their care.   Consent: Patient/Guardian gives verbal consent for treatment and assignment of benefits for services provided during this visit. Patient/Guardian expressed  understanding and agreed to proceed.    Bobbye Morton, MD 09/13/2022, 4:33 PM

## 2022-09-18 ENCOUNTER — Encounter (HOSPITAL_COMMUNITY): Payer: Self-pay

## 2022-09-18 ENCOUNTER — Ambulatory Visit (HOSPITAL_COMMUNITY): Payer: No Payment, Other | Admitting: Licensed Clinical Social Worker

## 2022-10-02 ENCOUNTER — Ambulatory Visit (INDEPENDENT_AMBULATORY_CARE_PROVIDER_SITE_OTHER): Payer: No Payment, Other | Admitting: Licensed Clinical Social Worker

## 2022-10-02 DIAGNOSIS — F411 Generalized anxiety disorder: Secondary | ICD-10-CM

## 2022-10-02 DIAGNOSIS — F4000 Agoraphobia, unspecified: Secondary | ICD-10-CM

## 2022-10-02 DIAGNOSIS — F502 Bulimia nervosa: Secondary | ICD-10-CM

## 2022-10-02 NOTE — Progress Notes (Signed)
   THERAPIST PROGRESS NOTE  Session Time: 68  Participation Level: Active  Behavioral Response: CasualAlertAnxious and Depressed  Type of Therapy: Individual Therapy  Treatment Goals addressed: LTG: Patient will score less than 5 on the Generalized Anxiety Disorder 7 Scale (GAD-7)           ProgressTowards Goals: Progressing  Interventions: Motivational Interviewing and Supportive  Summary: Cody Weaver is a 26 y.o. male who presents with depressed and anxious mood\affect.  Patient was pleasant, cooperative, maintained good eye contact.  Patient was alert and oriented x 5.  Patient endorses a decrease in anxiety and depression since starting to take his medications for worry, tension, worthlessness, and hopelessness.  Patient reports that he still struggles with body image issues due to dermatology diagnosis.  Patient states that he is still struggling with self worth although it has improved.  Patient states that it is a struggle figuring out what he wants to do for the rest of his life.Garcia states that he did not have a male role model or adult figure to teach him about things such as retirements, Tree surgeon, and taxes.  He reports struggling with finding what he wants to do with the rest of his life.  Suicidal/Homicidal: Nowithout intent/plan  Therapist Response:     Intervention/Plan: LCSW supportive therapy for praise and encouragement.  LCSW used solution focused therapy for talking with patient about a planner to organize school and other daily activities.  LCSW psychoanalytic therapy for patient to express thoughts, feelings and emotions.  LCSW used motivational interviewing for open-ended questions, positive affirmations, and reflective listening.  Patient reports taking medication 7 out of 7 days/week.  Patient reports triggers for depression and anxiety as work.  Plan: Return again in 3 weeks.  Diagnosis: GAD (generalized anxiety disorder)  Bulimia  nervosa  Agoraphobia  Collaboration of Care: Other None today   Patient/Guardian was advised Release of Information must be obtained prior to any record release in order to collaborate their care with an outside provider. Patient/Guardian was advised if they have not already done so to contact the registration department to sign all necessary forms in order for [place] to release information regarding their care.   Consent: Patient/Guardian gives verbal consent for treatment and assignment of benefits for services provided during this visit. Patient/Guardian expressed understanding and agreed to proceed.   [name], LCSW 10/02/2022

## 2022-10-30 ENCOUNTER — Telehealth (HOSPITAL_COMMUNITY): Payer: Self-pay | Admitting: Licensed Clinical Social Worker

## 2022-11-02 ENCOUNTER — Ambulatory Visit (INDEPENDENT_AMBULATORY_CARE_PROVIDER_SITE_OTHER): Payer: Medicaid Other | Admitting: Physician Assistant

## 2022-11-02 ENCOUNTER — Encounter (HOSPITAL_COMMUNITY): Payer: Self-pay | Admitting: Physician Assistant

## 2022-11-02 DIAGNOSIS — F411 Generalized anxiety disorder: Secondary | ICD-10-CM

## 2022-11-02 DIAGNOSIS — F4010 Social phobia, unspecified: Secondary | ICD-10-CM

## 2022-11-02 MED ORDER — HYDROXYZINE HCL 10 MG PO TABS: 10.0000 mg | ORAL_TABLET | Freq: Three times a day (TID) | ORAL | 1 refills | Status: AC | PRN

## 2022-11-02 MED ORDER — ESCITALOPRAM OXALATE 10 MG PO TABS: 10.0000 mg | ORAL_TABLET | Freq: Every day | ORAL | 2 refills | Status: DC

## 2022-11-02 NOTE — Progress Notes (Signed)
BH MD/PA/NP OP Progress Note  11/02/2022 4:03 PM Cody Weaver  MRN:  [id number]  Chief Complaint:  Chief Complaint  Patient presents with   Follow-up   Medication Refill   HPI:   Cody Weaver is a 27 year old, African-American male with a past psychiatric history significant for generalized anxiety disorder and social anxiety disorder who presented to [facility] for follow-up and medication management.  Patient was last seen by Dr. Morrie Sheldon on 09/13/2022.  During his last encounter, patient was being managed on the following medications:  Escitalopram 10 mg daily Hydroxyzine 10 mg 3 times daily as needed  Patient reports that he has been taking his medication more times than not.  Patient reports that he does not take his medication consistently because whenever he takes the medication, he states that he does not feel like himself.  He admits to having difficulty with being consistent with medications in the past.  Although patient does not take his medication consistently, he reports that the medication has been helpful in allowing him to not over think and interact with other people without issue.  Patient endorses minimal depression but states that he does occasionally feel down about where he is in life.  Patient rates his depression at 3 out of 10 with 10 being severe.  Patient is interested in pursuing a career in technology stating that he wants to feel secure financially later in life and would like to have something to show for himself.  A GAD-7 screen was performed with the patient scored an 11.  Patient is alert and oriented x 4, pleasant, calm, cooperative, and fully engaged in conversation during the encounter.  Patient endorses good mood.  Patient denies suicidal or homicidal ideations.  He further denies auditory or visual hallucinations and does not appear to be responding to internal/external stimuli.  Patient states that he  does experience paranoia and severe psychosis when not taking his medications regularly.  Patient also states that he has never been able to have normal interactions with people in the past due to the interactions always ending up negative.  Patient endorses good sleep stating that he receives a decent amount of sleep in order for him to be alert the next day.  Patient endorses good appetite.  Patient denies alcohol consumption, tobacco use, and illicit drug use.  Visit Diagnosis:    ICD-10-CM   1. Generalized anxiety disorder  F41.1 escitalopram (LEXAPRO) 10 MG tablet    2. Social anxiety disorder  F40.10 escitalopram (LEXAPRO) 10 MG tablet      Past Psychiatric History:  No diagnoses prior to 07/20/2022  Current diagnoses: Generalized anxiety disorder Social anxiety disorder Agoraphobia  Past Medical History:  Past Medical History:  Diagnosis Date   Obesity    History reviewed. No pertinent surgical history.  Family Psychiatric History:  Patient denies family history of psychiatric illness  Family History: History reviewed. No pertinent family history.  Social History:  Social History   Socioeconomic History   Marital status: Single    Spouse name: Not on file   Number of children: Not on file   Years of education: Not on file   Highest education level: Not on file  Occupational History   Not on file  Tobacco Use   Smoking status: Never   Smokeless tobacco: Never  Substance and Sexual Activity   Alcohol use: Never   Drug use: Never   Sexual activity: Not on file  Other Topics  Concern   Not on file  Social History Narrative   Not on file   Social Determinants of Health   Financial Resource Strain: High Risk (08/29/2022)   Overall Financial Resource Strain (CARDIA)    Difficulty of Paying Living Expenses: Hard  Food Insecurity: No Food Insecurity (08/29/2022)   Hunger Vital Sign    Worried About Running Out of Food in the Last Year: Never true    Ran Out of  Food in the Last Year: Never true  Transportation Needs: No Transportation Needs (08/29/2022)   PRAPARE - Hydrologist (Medical): No    Lack of Transportation (Non-Medical): No  Physical Activity: Insufficiently Active (08/29/2022)   Exercise Vital Sign    Days of Exercise per Week: 3 days    Minutes of Exercise per Session: 30 min  Stress: Stress Concern Present (08/29/2022)   [place]    Feeling of Stress : Very much  Social Connections: Socially Isolated (08/29/2022)   Social Connection and Isolation Panel [NHANES]    Frequency of Communication with Friends and Family: More than three times a week    Frequency of Social Gatherings with Friends and Family: Twice a week    Attends Religious Services: Never    Marine scientist or Organizations: No    Attends Music therapist: Never    Marital Status: Never married    Allergies: No Known Allergies  Metabolic Disorder Labs: No results found for: "HGBA1C", "MPG" No results found for: "PROLACTIN" No results found for: "CHOL", "TRIG", "HDL", "CHOLHDL", "VLDL", "LDLCALC" No results found for: "TSH"  Therapeutic Level Labs: No results found for: "LITHIUM" No results found for: "VALPROATE" No results found for: "CBMZ"  Current Medications: Current Outpatient Medications  Medication Sig Dispense Refill   clindamycin (CLINDAGEL) 1 % gel SMARTSIG:1 Sparingly Topical Twice Daily     dicyclomine (BENTYL) 20 MG tablet Take 1 tablet (20 mg total) by mouth 2 (two) times daily. 20 tablet 0   escitalopram (LEXAPRO) 10 MG tablet Take 1 tablet (10 mg total) by mouth daily. 30 tablet 2   fluconazole (DIFLUCAN) 200 MG tablet Take 200 mg by mouth daily.     hydrOXYzine (ATARAX) 10 MG tablet Take 1 tablet (10 mg total) by mouth 3 (three) times daily as needed. 90 tablet 3   ketoconazole (NIZORAL) 2 % shampoo Apply topically.      omeprazole (PRILOSEC) 20 MG capsule Take 1 capsule (20 mg total) by mouth daily. 30 capsule 1   Vitamin D, Ergocalciferol, (DRISDOL) 1.25 MG (50000 UNIT) CAPS capsule Take 50,000 Units by mouth once a week.     No current facility-administered medications for this visit.     Musculoskeletal: Strength & Muscle Tone: within normal limits Gait & Station: normal Patient leans: N/A  Psychiatric Specialty Exam: Review of Systems  Psychiatric/Behavioral:  Negative for decreased concentration, dysphoric mood, hallucinations, self-injury, sleep disturbance and suicidal ideas. The patient is nervous/anxious. The patient is not hyperactive.     There were no vitals taken for this visit.There is no height or weight on file to calculate BMI.  General Appearance: Fairly Groomed  Eye Contact:  Good  Speech:  Clear and Coherent and Normal Rate  Volume:  Normal  Mood:  Anxious and Euthymic  Affect:  Appropriate  Thought Process:  Coherent  Orientation:  Full (Time, Place, and Person)  Thought Content: Logical   Suicidal Thoughts:  No  Homicidal Thoughts:  No  Memory:  Immediate;   Good Recent;   Good Remote;   Fair  Judgement:  Other:  Improving, but still not fair  Insight:  Fair  Psychomotor Activity:  Normal  Concentration:  Concentration: Good and Attention Span: Good  Recall:  Good  Fund of Knowledge: Good  Language: Good  Akathisia:  NA  Handed:  Right  AIMS (if indicated): not done  Assets:  Communication Skills Desire for Improvement Housing Resilience Social Support Vocational/Educational  ADL's:  Intact  Cognition: WNL  Sleep:  Good   Screenings: GAD-7    Flowsheet Row Clinical Support from 11/02/2022 in [facility] Counselor from 08/29/2022 in [facility] Office Visit from 07/20/2022 in [facility]  Total GAD-7 Score 11 12 16       PHQ2-9    Flowsheet Row Clinical Support from  11/02/2022 in [facility] Counselor from 08/29/2022 in [facility] Office Visit from 07/20/2022 in [facility]  PHQ-2 Total Score 0 2 1  PHQ-9 Total Score -- 10 --      Flowsheet Row Clinical Support from 11/02/2022 in [facility] Counselor from 08/29/2022 in [facility] Office Visit from 07/20/2022 in [facility]  C-SSRS RISK CATEGORY No Risk No Risk No Risk        Assessment and Plan:   Cody Weaver is a 27 year old, African-American male with a past psychiatric history significant for generalized anxiety disorder and social anxiety disorder who presented to [facility] for follow-up and medication management.  Patient admits to not taking his medications regularly due to disliking the way the medication makes him feel.  He does report that whenever he is on the medication, he is able to stay out of his head more as well as interact with people better.  Patient was encouraged to take his medications consistently to assess how effective the medication is in managing his symptoms.  Patient was agreeable to recommendation.  Patient was given refills on his current prescriptions.  Patient's medications to be e-prescribed to pharmacy of choice.  Collaboration of Care: Collaboration of Care: Medication Management AEB patient's medications being managed by a mental health provider, Psychiatrist AEB patient being followed by mental health provider, and Referral or follow-up with counselor/therapist AEB patient being seen by a licensed clinical social worker at this facility  Patient/Guardian was advised Release of Information must be obtained prior to any record release in order to collaborate their care with an outside provider. Patient/Guardian was advised if they have not already done so to  contact the registration department to sign all necessary forms in order for [facility] to release information regarding their care.   Consent: Patient/Guardian gives verbal consent for treatment and assignment of benefits for services provided during this visit. Patient/Guardian expressed understanding and agreed to proceed.   1. Generalized anxiety disorder  - escitalopram (LEXAPRO) 10 MG tablet; Take 1 tablet (10 mg total) by mouth daily.  Dispense: 30 tablet; Refill: 2 - hydrOXYzine (ATARAX) 10 MG tablet; Take 1 tablet (10 mg total) by mouth 3 (three) times daily as needed.  Dispense: 75 tablet; Refill: 1  2. Social anxiety disorder  - escitalopram (LEXAPRO) 10 MG tablet; Take 1 tablet (10 mg total) by mouth daily.  Dispense: 30 tablet; Refill: 2  Patient to follow up in 6  weeks Provider spent a total of 39 minutes with the patient Caesarean patient's chart  Malachy Mood, PA 11/02/2022, 4:03 PM

## 2022-11-06 ENCOUNTER — Ambulatory Visit (INDEPENDENT_AMBULATORY_CARE_PROVIDER_SITE_OTHER): Payer: No Payment, Other | Admitting: Licensed Clinical Social Worker

## 2022-11-06 DIAGNOSIS — F4010 Social phobia, unspecified: Secondary | ICD-10-CM | POA: Diagnosis not present

## 2022-11-06 DIAGNOSIS — F411 Generalized anxiety disorder: Secondary | ICD-10-CM

## 2022-11-06 NOTE — Progress Notes (Signed)
THERAPIST PROGRESS NOTE  Virtual Visit via Video Note  I connected with Cody Weaver on 11/06/22 at  9:00 AM EST by a video enabled telemedicine application and verified that I am speaking with the correct person using two identifiers.  Location: Patient: [name]  Provider: Provider Home    I discussed the limitations of evaluation and management by telemedicine and the availability of in person appointments. The patient expressed understanding and agreed to proceed.  I discussed the assessment and treatment plan with the patient. The patient was provided an opportunity to ask questions and all were answered. The patient agreed with the plan and demonstrated an understanding of the instructions.   The patient was advised to call back or seek an in-person evaluation if the symptoms worsen or if the condition fails to improve as anticipated.  I provided 45 minutes of non-face-to-face time during this encounter.   Cody [name], LCSW   Participation Level: Active  Behavioral Response: CasualAlertAnxious and Depressed  Type of Therapy: Individual Therapy  Treatment Goals addressed:  Active     Anxiety Disorder CCP Problem  1 Agoraphobia       Identify 3 triggers for anxiety  (Progressing)     Start:  08/29/22    Expected End:  01/25/23       Goal Note     Future employment and career opportunities         workout 3 x weekly  (Not Progressing)     Start:  08/29/22    Expected End:  01/25/23       Goal Note     Patient reports no workouts in the last 2 weeks         no purging 7/7 days weekly  (Progressing)     Start:  08/29/22    Expected End:  01/25/23       Goal Note     Patient reports no purging once in the last 2 weeks         maintain 20 hour weekly of employment  (Progressing)     Start:  08/29/22    Expected End:  01/25/23       Goal Note     Patient self-reports researching job and career opportunities.  LCSW provided  patient with resources to [place] works         create 3 coping skills for anxiety  (Progressing)     Start:  08/29/22    Expected End:  01/25/23         LTG: Patient will score less than 5 on the Generalized Anxiety Disorder 7 Scale (GAD-7) (Not Progressing)     Start:  08/29/22    Expected End:  01/25/23       Goal Note     LCSW did not administered the GAD-7         STG: Patient will participate in at least 80% of scheduled individual psychotherapy sessions (Progressing)     Start:  08/29/22    Expected End:  01/25/23       Goal Note     Patient has not missed a session for individual therapy to date         STG: Patient will complete at least 80% of assigned homework (Progressing)     Start:  08/29/22    Expected End:  01/25/23            ProgressTowards Goals: Progressing  Interventions: CBT, Motivational Interviewing, and Supportive  Summary: Cody Weaver is a 27 y.o. male who presents with depressed, flat, anxious mood\affect.  Cody Weaver was alert and oriented x 5.  He was pleasant, cooperative, and maintained good eye contact.  Patient reports today that primary stressor is "the future".  He reports not being happy where he is currently at in life with his career, financials, and housing. Cody Weaver reports researching job opportunities, degree opportunities, and certification opportunities.  Patient reports comparing himself to others and looking at other people in their career path and asking himself "how did they get there, how long did it take him to, and how much do they make".  Suicidal/Homicidal: Nowithout intent/plan  Therapist Response:    Intervention/Plan: LCSW utilized solution focused therapy by providing patient resources to [employer] works".  Patient to follow-up with them 1 time before next appointment in 2 weeks.  LCSW used motivational interviewing for open-ended questions, positive affirmations, and reflective listening.  LCSW utilized  praise and encouragement for supportive therapy.  Plan: Return again in 3 weeks.  Diagnosis: No diagnosis found.  Collaboration of Care: Other None today   Patient/Guardian was advised Release of Information must be obtained prior to any record release in order to collaborate their care with an outside provider. Patient/Guardian was advised if they have not already done so to contact the registration department to sign all necessary forms in order for [place] to release information regarding their care.   Consent: Patient/Guardian gives verbal consent for treatment and assignment of benefits for services provided during this visit. Patient/Guardian expressed understanding and agreed to proceed.   Dory Horn, LCSW 11/06/2022

## 2022-11-20 ENCOUNTER — Ambulatory Visit (HOSPITAL_COMMUNITY): Payer: Medicaid Other | Admitting: Licensed Clinical Social Worker

## 2022-11-27 ENCOUNTER — Telehealth (HOSPITAL_COMMUNITY): Payer: No Payment, Other | Admitting: Student in an Organized Health Care Education/Training Program

## 2022-12-12 ENCOUNTER — Ambulatory Visit (HOSPITAL_COMMUNITY): Payer: Medicaid Other | Admitting: Licensed Clinical Social Worker

## 2022-12-12 DIAGNOSIS — F411 Generalized anxiety disorder: Secondary | ICD-10-CM

## 2022-12-12 DIAGNOSIS — F4000 Agoraphobia, unspecified: Secondary | ICD-10-CM

## 2022-12-12 NOTE — Progress Notes (Unsigned)
   THERAPIST PROGRESS NOTE  Virtual Visit via Video Note  I connected with Cody Weaver on 12/12/22 at  4:00 PM EST by a video enabled telemedicine application and verified that I am speaking with the correct person using two identifiers.  Location: Patient: [name]  Provider: [facility]   I discussed the limitations of evaluation and management by telemedicine and the availability of in person appointments. The patient expressed understanding and agreed to proceed.  History of Present Illness:    Observations/Objective:   Assessment and Plan:   Follow Up Instructions:    I discussed the assessment and treatment plan with the patient. The patient was provided an opportunity to ask questions and all were answered. The patient agreed with the plan and demonstrated an understanding of the instructions.   The patient was advised to call back or seek an in-person evaluation if the symptoms worsen or if the condition fails to improve as anticipated.  I provided 40 minutes of non-face-to-face time during this encounter.   Cody Horn, LCSW   Participation Level: Active  Behavioral Response: CasualAlertAnxious and Depressed  Type of Therapy: Individual Therapy  Treatment Goals addressed:   ProgressTowards Goals: Progressing  Interventions: CBT, Motivational Interviewing, and Supportive    Suicidal/Homicidal: Nowithout intent/plan  Therapist Response:    Pt was alert and oriented x 5. He was dressed casually and engaged well in therapy session. He presented with depressed and anxious mood/affect. [name] was pleasant, cooperative, and maintained good eye contact.   Pt reports primary stressor is school, work, and his future. [name] states that he has been struggling to find what he wants to do for the rest of his life. He has decided to go back to school full time and has a position lined up to work at the [employer] part time. Pt reports that he has started to talk with his old college where he is currently on academic probation and figuring out the process to get back into school. He reports tension and worry picking a career path. Pt reports that he is struggling finding a career that balances personal life, income, and something that interests him.   Intervention/Plan: LCSW used psychoanalytic therapy for pt to express thoughts and feeling in session.   Plan: Return again in 4 weeks.  Diagnosis: GAD (generalized anxiety disorder)  Agoraphobia  Collaboration of Care: Other None today   Patient/Guardian was advised Release of Information must be obtained prior to any record release in order to collaborate their care with an outside provider. Patient/Guardian was advised if they have not already done so to contact the registration department to sign all necessary forms in order for [place] to release information regarding their care.   Consent: Patient/Guardian gives verbal consent for treatment and assignment of benefits for services provided during this visit. Patient/Guardian expressed understanding and agreed to proceed.   Cody Horn, LCSW 12/12/2022

## 2022-12-14 ENCOUNTER — Ambulatory Visit (INDEPENDENT_AMBULATORY_CARE_PROVIDER_SITE_OTHER): Payer: Medicaid Other | Admitting: Physician Assistant

## 2022-12-14 ENCOUNTER — Encounter (HOSPITAL_COMMUNITY): Payer: Self-pay | Admitting: Physician Assistant

## 2022-12-14 DIAGNOSIS — F4010 Social phobia, unspecified: Secondary | ICD-10-CM

## 2022-12-14 DIAGNOSIS — F411 Generalized anxiety disorder: Secondary | ICD-10-CM | POA: Diagnosis not present

## 2022-12-14 MED ORDER — ESCITALOPRAM OXALATE 10 MG PO TABS: 10.0000 mg | ORAL_TABLET | Freq: Every day | ORAL | 2 refills | Status: DC

## 2022-12-14 NOTE — Progress Notes (Unsigned)
[place] MD/PA/NP OP Progress Note  12/14/2022 12:38 PM Cody Weaver  MRN:  [id number]  Chief Complaint:  Chief Complaint  Patient presents with   Medication Refill   Follow-up   HPI: ***  Cody Weaver  Visit Diagnosis:    ICD-10-CM   1. Generalized anxiety disorder  F41.1 escitalopram (LEXAPRO) 10 MG tablet    2. Social anxiety disorder  F40.10 escitalopram (LEXAPRO) 10 MG tablet      Past Psychiatric History:  No diagnoses prior to 07/20/2022   Current diagnoses: Generalized anxiety disorder Social anxiety disorder Agoraphobia  Past Medical History:  Past Medical History:  Diagnosis Date   Obesity    History reviewed. No pertinent surgical history.  Family Psychiatric History:  Patient denies family history of psychiatric illness   Family History: History reviewed. No pertinent family history.  Social History:  Social History   Socioeconomic History   Marital status: Single    Spouse name: Not on file   Number of children: Not on file   Years of education: Not on file   Highest education level: Not on file  Occupational History   Not on file  Tobacco Use   Smoking status: Never   Smokeless tobacco: Never  Substance and Sexual Activity   Alcohol use: Never   Drug use: Never   Sexual activity: Not on file  Other Topics Concern   Not on file  Social History Narrative   Not on file   Social Determinants of Health   Financial Resource Strain: High Risk (08/29/2022)   Overall Financial Resource Strain (CARDIA)    Difficulty of Paying Living Expenses: Hard  Food Insecurity: No Food Insecurity (08/29/2022)   Hunger Vital Sign    Worried About Running Out of Food in the Last Year: Never true    Ran Out of Food in the Last Year: Never true  Transportation Needs: No Transportation Needs (08/29/2022)   PRAPARE - Hydrologist (Medical): No    Lack of Transportation (Non-Medical): No  Physical Activity: Insufficiently  Active (08/29/2022)   Exercise Vital Sign    Days of Exercise per Week: 3 days    Minutes of Exercise per Session: 30 min  Stress: Stress Concern Present (08/29/2022)   [place]    Feeling of Stress : Very much  Social Connections: Socially Isolated (08/29/2022)   Social Connection and Isolation Panel [NHANES]    Frequency of Communication with Friends and Family: More than three times a week    Frequency of Social Gatherings with Friends and Family: Twice a week    Attends Religious Services: Never    Marine scientist or Organizations: No    Attends Music therapist: Never    Marital Status: Never married    Allergies: No Known Allergies  Metabolic Disorder Labs: No results found for: "HGBA1C", "MPG" No results found for: "PROLACTIN" No results found for: "CHOL", "TRIG", "HDL", "CHOLHDL", "VLDL", "LDLCALC" No results found for: "TSH"  Therapeutic Level Labs: No results found for: "LITHIUM" No results found for: "VALPROATE" No results found for: "CBMZ"  Current Medications: Current Outpatient Medications  Medication Sig Dispense Refill   clindamycin (CLINDAGEL) 1 % gel SMARTSIG:1 Sparingly Topical Twice Daily     dicyclomine (BENTYL) 20 MG tablet Take 1 tablet (20 mg total) by mouth 2 (two) times daily. 20 tablet 0   escitalopram (LEXAPRO) 10 MG tablet Take 1 tablet (10  mg total) by mouth daily. 30 tablet 2   fluconazole (DIFLUCAN) 200 MG tablet Take 200 mg by mouth daily.     hydrOXYzine (ATARAX) 10 MG tablet Take 1 tablet (10 mg total) by mouth 3 (three) times daily as needed. 75 tablet 1   ketoconazole (NIZORAL) 2 % shampoo Apply topically.     omeprazole (PRILOSEC) 20 MG capsule Take 1 capsule (20 mg total) by mouth daily. 30 capsule 1   Vitamin D, Ergocalciferol, (DRISDOL) 1.25 MG (50000 UNIT) CAPS capsule Take 50,000 Units by mouth once a week.     No current facility-administered  medications for this visit.     Musculoskeletal: Strength & Muscle Tone: within normal limits Gait & Station: normal Patient leans: N/A  Psychiatric Specialty Exam: Review of Systems  Psychiatric/Behavioral:  Negative for decreased concentration, dysphoric mood, hallucinations, self-injury, sleep disturbance and suicidal ideas. The patient is not nervous/anxious and is not hyperactive.     There were no vitals taken for this visit.There is no height or weight on file to calculate BMI.  General Appearance: Casual  Eye Contact:  Good  Speech:  Clear and Coherent and Normal Rate  Volume:  Normal  Mood:  Euthymic  Affect:  Appropriate  Thought Process:  Coherent and Descriptions of Associations: Intact  Orientation:  Full (Time, Place, and Person)  Thought Content: WDL   Suicidal Thoughts:  No  Homicidal Thoughts:  No  Memory:  Immediate;   Good Recent;   Good Remote;   Fair  Judgement:  Good  Insight:  Fair  Psychomotor Activity:  Normal  Concentration:  Concentration: Good and Attention Span: Good  Recall:  Good  Fund of Knowledge: Good  Language: Good  Akathisia:  NA  Handed:  Right  AIMS (if indicated): not done  Assets:  Communication Skills Desire for Improvement Housing Resilience Social Support Vocational/Educational  ADL's:  Intact  Cognition: WNL  Sleep:  Good   Screenings: GAD-7    Flowsheet Row Clinical Support from 11/02/2022 in [facility] Counselor from 08/29/2022 in [facility] Office Visit from 07/20/2022 in [facility]  Total GAD-7 Score 11 12 16      $ PHQ2-9    [place] from 12/14/2022 in [place] from 11/02/2022 in [facility] Counselor from 08/29/2022 in [facility] Office Visit from 07/20/2022 in [facility]   PHQ-2 Total Score 2 0 2 1  PHQ-9 Total Score 6 -- 10 --      Flowsheet Row Clinical Support from 12/14/2022 in [place] from 11/02/2022 in [facility] Counselor from 08/29/2022 in [place] No Risk No Risk No Risk        Assessment and Plan: ***    Collaboration of Care: Collaboration of Care: Medication Management AEB provider managing patient's psychiatric medications, Psychiatrist AEB patient being followed by mental health provider at this facility, and Referral or follow-up with counselor/therapist AEB patient being seen by a licensed clinical social worker at this facility  Patient/Guardian was advised Release of Information must be obtained prior to any record release in order to collaborate their care with an outside provider. Patient/Guardian was advised if they have not already done so to contact the registration department to sign all necessary forms in order for [place] to release  information regarding their care.   Consent: Patient/Guardian gives verbal consent for treatment and assignment of benefits for services provided during this visit. Patient/Guardian expressed understanding and agreed to proceed.   1. Generalized anxiety disorder  - escitalopram (LEXAPRO) 10 MG tablet; Take 1 tablet (10 mg total) by mouth daily.  Dispense: 30 tablet; Refill: 2  2. Social anxiety disorder  - escitalopram (LEXAPRO) 10 MG tablet; Take 1 tablet (10 mg total) by mouth daily.  Dispense: 30 tablet; Refill: 2  Patient to follow-up in 6 weeks Provider spent a total of 39 minutes with the patient/reviewing patient's chart  Malachy Mood, PA 12/14/2022, 12:38 PM

## 2023-01-10 ENCOUNTER — Ambulatory Visit (INDEPENDENT_AMBULATORY_CARE_PROVIDER_SITE_OTHER): Payer: Medicaid Other | Admitting: Licensed Clinical Social Worker

## 2023-01-10 DIAGNOSIS — F502 Bulimia nervosa: Secondary | ICD-10-CM

## 2023-01-10 DIAGNOSIS — F411 Generalized anxiety disorder: Secondary | ICD-10-CM

## 2023-01-10 DIAGNOSIS — F4000 Agoraphobia, unspecified: Secondary | ICD-10-CM

## 2023-01-10 NOTE — Progress Notes (Signed)
THERAPIST PROGRESS NOTE  Virtual Visit via Video Note  I connected with Cody Weaver on 01/10/23 at  3:00 PM EDT by a video enabled telemedicine application and verified that I am speaking with the correct person using two identifiers.  Location: Patient: [facility]  Provider: Providers Home    I discussed the limitations of evaluation and management by telemedicine and the availability of in person appointments. The patient expressed understanding and agreed to proceed.     I discussed the assessment and treatment plan with the patient. The patient was provided an opportunity to ask questions and all were answered. The patient agreed with the plan and demonstrated an understanding of the instructions.   The patient was advised to call back or seek an in-person evaluation if the symptoms worsen or if the condition fails to improve as anticipated.  I provided 45 minutes of non-face-to-face time during this encounter.   Dory Horn, LCSW   Participation Level: Active  Behavioral Response: CasualAlertAnxious and Depressed  Type of Therapy: Individual Therapy  Treatment Goals addressed:  Active     Anxiety Disorder CCP Problem  1 Agoraphobia       Identify 3 triggers for anxiety  (Progressing)     Start:  08/29/22    Expected End:  01/25/23         workout 3 x weekly  (Progressing)     Start:  08/29/22    Expected End:  01/25/23         no purging 7/7 days weekly  (Progressing)     Start:  08/29/22    Expected End:  01/25/23         maintain 20 hour weekly of employment  (Not Progressing)     Start:  08/29/22    Expected End:  01/25/23         create 3 coping skills for anxiety  (Progressing)     Start:  08/29/22    Expected End:  01/25/23       Goal Note     partialization         LTG: Patient will score less than 5 on the Generalized Anxiety Disorder 7 Scale (GAD-7) (Not Progressing)     Start:  08/29/22    Expected End:  01/25/23        Goal Note     Not completed in session today          STG: Patient will participate in at least 80% of scheduled individual psychotherapy sessions (Progressing)     Start:  08/29/22    Expected End:  01/25/23         STG: Patient will complete at least 80% of assigned homework (Progressing)     Start:  08/29/22    Expected End:  01/25/23            ProgressTowards Goals: Progressing  Interventions: CBT, Motivational Interviewing, and Supportive   Suicidal/Homicidal: Nowithout intent/plan  Therapist Response:    Pt was alert and oriented x 5. He was dressed casually and engaged well in therapy session. Cody Weaver presented with anxious mood/affect. Pt was pleasant, cooperative and maintained good eye contact.   Pt reports primary stressors are school and illness. Pt endorses symptoms for poor self-image, lack of confidence, tension and worry. Pt reports that he has spoken with dermatologist that has put him on a medication that is working for his skin's problems. Pt reports although the medications seem to be right, he  did not feel heard at the doctor's office. Cody Weaver also reports that he will be starting school in 1 month for what he believes will be a cyber security degree.   Interventions/Plan: Pt endorse coping skills for working out multiple time per week. LCSW educated pt on the benefits of work outs and how it can decrease anxiety and depression. LCSW used psychoanalytic therapy for pt to express thoughts, feeling and concerns.   Plan: Return again in 3 weeks.  Diagnosis: No diagnosis found.  Collaboration of Care: Other None today   Patient/Guardian was advised Release of Information must be obtained prior to any record release in order to collaborate their care with an outside provider. Patient/Guardian was advised if they have not already done so to contact the registration department to sign all necessary forms in order for [place] to release information regarding  their care.   Consent: Patient/Guardian gives verbal consent for treatment and assignment of benefits for services provided during this visit. Patient/Guardian expressed understanding and agreed to proceed.   Dory Horn, LCSW 01/10/2023

## 2023-01-25 ENCOUNTER — Encounter (HOSPITAL_COMMUNITY): Payer: Self-pay | Admitting: Physician Assistant

## 2023-01-26 ENCOUNTER — Telehealth: Payer: Medicaid Other | Admitting: Family Medicine

## 2023-01-26 NOTE — Progress Notes (Signed)
Pt did not show for visit. DWB 

## 2023-01-28 ENCOUNTER — Encounter (HOSPITAL_COMMUNITY): Payer: Self-pay

## 2023-01-28 ENCOUNTER — Telehealth: Payer: Medicaid Other

## 2023-01-28 ENCOUNTER — Ambulatory Visit (INDEPENDENT_AMBULATORY_CARE_PROVIDER_SITE_OTHER): Payer: Medicaid Other | Admitting: Licensed Clinical Social Worker

## 2023-01-28 ENCOUNTER — Telehealth: Payer: Medicaid Other | Admitting: Physician Assistant

## 2023-01-28 DIAGNOSIS — F4000 Agoraphobia, unspecified: Secondary | ICD-10-CM

## 2023-01-28 DIAGNOSIS — L7211 Pilar cyst: Secondary | ICD-10-CM

## 2023-01-28 DIAGNOSIS — F411 Generalized anxiety disorder: Secondary | ICD-10-CM

## 2023-01-28 DIAGNOSIS — B350 Tinea barbae and tinea capitis: Secondary | ICD-10-CM | POA: Diagnosis not present

## 2023-01-28 MED ORDER — TERBINAFINE HCL 250 MG PO TABS: 250.0000 mg | ORAL_TABLET | Freq: Every day | ORAL | 0 refills | Status: DC

## 2023-01-28 MED ORDER — DOXYCYCLINE HYCLATE 100 MG PO TABS: 100.0000 mg | ORAL_TABLET | Freq: Two times a day (BID) | ORAL | 0 refills | Status: DC

## 2023-01-28 NOTE — Progress Notes (Signed)
Virtual Visit Consent   Cody Weaver, you are scheduled for a virtual visit with a [facility] provider today. Just as with appointments in the office, your consent must be obtained to participate. Your consent will be active for this visit and any virtual visit you may have with one of our providers in the next 365 days. If you have a MyChart account, a copy of this consent can be sent to you electronically.  As this is a virtual visit, video technology does not allow for your provider to perform a traditional examination. This may limit your provider's ability to fully assess your condition. If your provider identifies any concerns that need to be evaluated in person or the need to arrange testing (such as labs, EKG, etc.), we will make arrangements to do so. Although advances in technology are sophisticated, we cannot ensure that it will always work on either your end or our end. If the connection with a video visit is poor, the visit may have to be switched to a telephone visit. With either a video or telephone visit, we are not always able to ensure that we have a secure connection.  By engaging in this virtual visit, you consent to the provision of healthcare and authorize for your insurance to be billed (if applicable) for the services provided during this visit. Depending on your insurance coverage, you may receive a charge related to this service.  I need to obtain your verbal consent now. Are you willing to proceed with your visit today? ESTHER SELLARS has provided verbal consent on 01/28/2023 for a virtual visit (video or telephone). Mar Daring, PA-C  Date: 01/28/2023 5:30 PM  Virtual Visit via Video Note   I, Mar Daring, connected with  VIRENDER GRIERSON  ([id number], 01/16/1996) on 01/28/23 at  4:45 PM EDT by a video-enabled telemedicine application and verified that I am speaking with the correct person using two identifiers.  Location: Patient: Virtual Visit  Location Patient: Home Provider: Virtual Visit Location Provider: Home Office   I discussed the limitations of evaluation and management by telemedicine and the availability of in person appointments. The patient expressed understanding and agreed to proceed.    History of Present Illness: Cody Weaver is a 27 y.o. who identifies as a male who was assigned male at birth, and is being seen today for rash.  HPI: Rash This is a recurrent problem. The current episode started more than 1 year ago. The problem has been waxing and waning since onset. The affected locations include the scalp. The rash is characterized by draining, dryness, itchiness, scaling, redness, swelling and pain. He was exposed to nothing. Pertinent negatives include no congestion, cough, facial edema, fatigue, fever, joint pain, shortness of breath or sore throat. Treatments tried: oral antibiotics, topical antibiotics, antifungal shampoo. The treatment provided no relief.     Problems:  Patient Active Problem List   Diagnosis Date Noted   Agoraphobia 08/29/2022   Bulimia nervosa 08/29/2022   GAD (generalized anxiety disorder) 07/20/2022    Allergies: No Known Allergies Medications:  Current Outpatient Medications:    doxycycline (VIBRA-TABS) 100 MG tablet, Take 1 tablet (100 mg total) by mouth 2 (two) times daily., Disp: 20 tablet, Rfl: 0   terbinafine (LAMISIL) 250 MG tablet, Take 1 tablet (250 mg total) by mouth daily., Disp: 90 tablet, Rfl: 0   clindamycin (CLINDAGEL) 1 % gel, SMARTSIG:1 Sparingly Topical Twice Daily, Disp: , Rfl:    dicyclomine (BENTYL) 20  MG tablet, Take 1 tablet (20 mg total) by mouth 2 (two) times daily., Disp: 20 tablet, Rfl: 0   escitalopram (LEXAPRO) 10 MG tablet, Take 1 tablet (10 mg total) by mouth daily., Disp: 30 tablet, Rfl: 2   hydrOXYzine (ATARAX) 10 MG tablet, Take 1 tablet (10 mg total) by mouth 3 (three) times daily as needed., Disp: 75 tablet, Rfl: 1   ketoconazole (NIZORAL) 2  % shampoo, Apply topically., Disp: , Rfl:    omeprazole (PRILOSEC) 20 MG capsule, Take 1 capsule (20 mg total) by mouth daily., Disp: 30 capsule, Rfl: 1   Vitamin D, Ergocalciferol, (DRISDOL) 1.25 MG (50000 UNIT) CAPS capsule, Take 50,000 Units by mouth once a week., Disp: , Rfl:   Observations/Objective: Patient is well-developed, well-nourished in no acute distress.  Resting comfortably at home.  Head is normocephalic, atraumatic.  No labored breathing.  Speech is clear and coherent with logical content.  Patient is alert and oriented at baseline.    Assessment and Plan: 1. Tinea capitis - terbinafine (LAMISIL) 250 MG tablet; Take 1 tablet (250 mg total) by mouth daily.  Dispense: 90 tablet; Refill: 0  2. Pilar cyst - doxycycline (VIBRA-TABS) 100 MG tablet; Take 1 tablet (100 mg total) by mouth 2 (two) times daily.  Dispense: 20 tablet; Refill: 0  - Hard to determine fully based on video quality. 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 The patient was provided an opportunity to ask questions and all were answered. The patient agreed with the plan and demonstrated an understanding of the instructions.  A copy of instructions were sent to the patient via MyChart unless otherwise noted below.    The patient was advised to call back or seek an in-person evaluation if the symptoms worsen or if the condition fails to improve as anticipated.  Time:  I spent 18 minutes with the patient via telehealth  technology discussing the above problems/concerns.    Mar Daring, PA-C

## 2023-01-28 NOTE — Progress Notes (Unsigned)
THERAPIST PROGRESS NOTE  Virtual Visit via Video Note  I connected with Cody Weaver on 01/28/23 at  2:00 PM EDT by a video enabled telemedicine application and verified that I am speaking with the correct person using two identifiers.  Location: Patient: Cody Weaver  Provider: Providers Home    I discussed the limitations of evaluation and management by telemedicine and the availability of in person appointments. The patient expressed understanding and agreed to proceed.     I discussed the assessment and treatment plan with the patient. The patient was provided an opportunity to ask questions and all were answered. The patient agreed with the plan and demonstrated an understanding of the instructions.   The patient was advised to call back or seek an in-person evaluation if the symptoms worsen or if the condition fails to improve as anticipated.  I provided 45 minutes of non-face-to-face time during this encounter.   Cody Horn, LCSW   Participation Level: Active  Behavioral Response: CasualAlertAnxious and Depressed  Type of Therapy: Individual Therapy  Treatment Goals addressed:  Active     Anxiety Disorder CCP Problem  1 Agoraphobia       Identify 3 triggers for anxiety  (Progressing)     Start:  08/29/22    Expected End:  07/29/23         workout 3 x weekly  (Progressing)     Start:  08/29/22    Expected End:  07/29/23         no purging 7/7 days weekly  (Progressing)     Start:  08/29/22    Expected End:  07/29/23         maintain 20 hour weekly of employment  (Not Progressing)     Start:  08/29/22    Expected End:  07/29/23         create 3 coping skills for anxiety  (Progressing)     Start:  08/29/22    Expected End:  07/29/23         LTG: Patient will score less than 5 on the Generalized Anxiety Disorder 7 Scale (GAD-7) (Not Progressing)     Start:  08/29/22    Expected End:  07/29/23       Goal Note     Not completed in  session today          STG: Patient will participate in at least 80% of scheduled individual psychotherapy sessions (Progressing)     Start:  08/29/22    Expected End:  07/29/23         STG: Patient will complete at least 80% of assigned homework (Progressing)     Start:  08/29/22    Expected End:  07/29/23            ProgressTowards Goals: Progressing  Interventions: CBT, Motivational Interviewing, and Supportive   Suicidal/Homicidal: Nowithout intent/plan  Therapist Response:  Patient was alert and oriented x 5.  Patient was pleasant, cooperative, maintained good eye contact.  He engaged well in therapy session was dressed casually.  Patient comes in today 1 hour late for an appointment.  LCSW contacted patient after a no-show for another patient.  LCSW explained to patient no-show policy and late policy.  Patient reports that he was having technical difficulties.  LCSW told patient if technical difficulties arise again to please call the front desk so that they can be faxed.  Patient was agreeable.  Cody Weaver primary stressor was for family conflict with  mother.  Patient reports that his mother has her number listed as primary contact for him.  He reports that all text messages for links for appointments due to her phone.  LCSW solution-focused therapy as patient is an adult and mother is not legal guardian to update contact information to patient's phone number.  Other stressors for patient are his weight.  Patient reports that he has a goal of getting down to 200 pounds but trying to do it the right way without purging.  Patient reports that he has bought a treadmill and is exercising regularly.  On the patient's goals is to exercise 3 times weekly.  And patient is progressing towards goal.  LCSW psychoanalytic therapy for patient to express thoughts, feelings and emotions.  LCSW solution-focused therapy to take patient's contact information.  LCSW educated patient on how moderate  exercise can be beneficial for anxiety and depression.  LCSW spoke with patient today about proper dieting.    Plan: Return again in 3 weeks.  Diagnosis: GAD (generalized anxiety disorder)  Agoraphobia  Collaboration of Care: Other None today   Patient/Guardian was advised Release of Information must be obtained prior to any record release in order to collaborate their care with an outside provider. Patient/Guardian was advised if they have not already done so to contact the registration department to sign all necessary forms in order for [place] to release information regarding their care.   Consent: Patient/Guardian gives verbal consent for treatment and assignment of benefits for services provided during this visit. Patient/Guardian expressed understanding and agreed to proceed.   Cody Horn, LCSW 01/28/2023

## 2023-01-28 NOTE — Patient Instructions (Signed)
Cody Weaver, thank you for joining Mar Daring, PA-C for today's virtual visit.  While this provider is not your primary care provider (PCP), if your PCP is located in our provider database this encounter information will be shared with them immediately following your visit.   [facility] account gives you access to today's visit and all your visits, tests, and labs performed at [facility] " click here if you don't have a [facility] account or go to [email or web address]  Consent: (Patient) Cody Weaver provided verbal consent for this virtual visit at the beginning of the encounter.  Current Medications:  Current Outpatient Medications:    doxycycline (VIBRA-TABS) 100 MG tablet, Take 1 tablet (100 mg total) by mouth 2 (two) times daily., Disp: 20 tablet, Rfl: 0   terbinafine (LAMISIL) 250 MG tablet, Take 1 tablet (250 mg total) by mouth daily., Disp: 90 tablet, Rfl: 0   clindamycin (CLINDAGEL) 1 % gel, SMARTSIG:1 Sparingly Topical Twice Daily, Disp: , Rfl:    dicyclomine (BENTYL) 20 MG tablet, Take 1 tablet (20 mg total) by mouth 2 (two) times daily., Disp: 20 tablet, Rfl: 0   escitalopram (LEXAPRO) 10 MG tablet, Take 1 tablet (10 mg total) by mouth daily., Disp: 30 tablet, Rfl: 2   hydrOXYzine (ATARAX) 10 MG tablet, Take 1 tablet (10 mg total) by mouth 3 (three) times daily as needed., Disp: 75 tablet, Rfl: 1   ketoconazole (NIZORAL) 2 % shampoo, Apply topically., Disp: , Rfl:    omeprazole (PRILOSEC) 20 MG capsule, Take 1 capsule (20 mg total) by mouth daily., Disp: 30 capsule, Rfl: 1   Vitamin D, Ergocalciferol, (DRISDOL) 1.25 MG (50000 UNIT) CAPS capsule, Take 50,000 Units by mouth once a week., Disp: , Rfl:    Medications ordered in this encounter:  Meds ordered this encounter  Medications   doxycycline (VIBRA-TABS) 100 MG tablet    Sig: Take 1 tablet (100 mg total) by mouth 2 (two) times daily.    Dispense:  20 tablet     Refill:  0    Order Specific Question:   Supervising Provider    Answer:   Chase Picket [id number]   terbinafine (LAMISIL) 250 MG tablet    Sig: Take 1 tablet (250 mg total) by mouth daily.    Dispense:  90 tablet    Refill:  0    Order Specific Question:   Supervising Provider    Answer:   Chase Picket [id number]     *If you need refills on other medications prior to your next appointment, please contact your pharmacy*  Follow-Up: Call back or seek an in-person evaluation if the symptoms worsen or if the condition fails to improve as anticipated.  [place] [phone]  Other Instructions Tinea capitis Fungal infection - scalp; Tinea of the scalp; Tinea - capitis  Ringworm of the scalp is a fungal infection that affects the scalp. It is also called tinea capitis.  Related ringworm infections may be found:  In a man's beard In the groin (jock itch) Between the toes (athlete's foot) Other places on the skin   Causes Fungi are germs that can live on the dead tissue of the hair, nails, and outer skin layers. Ringworm of the scalp is caused by mold-like fungi called dermatophytes.  The fungi grow well in warm, moist areas. A tinea infection is more likely if you:  Have minor skin or scalp injuries Do not bathe or wash  your hair often Have wet skin for a long time (such as from sweating) Ringworm can spread quickly. It most often affects children and goes away at puberty. However, it can occur at any age.  You can catch ringworm if you come into direct contact with an area of ringworm on someone else's body. You can also get it if you touch items such as combs, hats, or clothing that have been used by someone with ringworm. The infection can also be spread by pets, particularly cats.  Symptoms Ringworm may involve part or all of the scalp. The affected areas:  Are bald with small black dots due to hair that has broken off Have round, scaly areas of  skin that are red or swollen (inflamed) May have pus-filled sores called kerions May be very itchy You may have a low-grade fever of around 100F to 101F (37.8C to 38.3C) or swollen lymph nodes in the neck.  Ringworm may cause permanent hair loss and lasting scars.  Exams and Tests Your health care provider will look at your scalp for signs of ringworm.  You may also need the following tests:  Examination of a skin scraping from the rash under a microscope using a special test Skin culture for fungus  Skin biopsy (rarely needed) Treatment Your provider will prescribe medicine you take by mouth to treat ringworm on the scalp. You will need to take the medicine for 4 to 8 weeks.  Steps you can do at home include:  Keeping your scalp clean. Washing with a medicated shampoo, such as one that contains ketoconazole or selenium sulfide. Shampooing may slow or stop the spread of infection, but it does not get rid of ringworm. Other family members and pets should be examined and treated, if necessary.  Other children in the home may want to use the shampoo 2 to 3 times a week for about 6 weeks. Adults only need to wash with the shampoo if they have signs of tinea capitis or ringworm. Once the shampoo has been started:  Wash towels in hot, soapy water and dry them using the hottest heat recommended on the care label. This should be done each time the towels are used by someone who is infected. Soak combs and brushes for 1 hour a day in a mixture of 1 part bleach to 10 parts water. Do this for 3 days in a row. No one in the home should share combs, hairbrushes, hats, towels, pillowcases, or helmets with other people.  [place] (Prognosis) It may be hard to get rid of ringworm. Also, the problem may come back after it is treated. In many cases, it gets better on its own after puberty.  Possible Complications When to Contact a Medical Professional Contact your provider if you have symptoms  of ringworm of the scalp and home care is not enough to get rid of the condition.   If you have been instructed to have an in-person evaluation today at a local Urgent Care facility, please use the link below. It will take you to a list of all of our available [facility], including address, phone number and hours of operation. Please do not delay care.  [facility]  If you or a family member do not have a primary care provider, use the link below to schedule a visit and establish care. When you choose a  primary care physician or advanced practice provider, you gain a long-term partner in health. Find a  Primary Care Provider  Learn more about [facility]'s in-office and virtual care options: [facility] - Get Care Now

## 2023-02-01 ENCOUNTER — Encounter (HOSPITAL_COMMUNITY): Payer: Medicaid Other | Admitting: Physician Assistant

## 2023-02-13 ENCOUNTER — Ambulatory Visit (INDEPENDENT_AMBULATORY_CARE_PROVIDER_SITE_OTHER): Payer: Medicaid Other | Admitting: Physician Assistant

## 2023-02-13 ENCOUNTER — Encounter (HOSPITAL_COMMUNITY): Payer: Self-pay | Admitting: Physician Assistant

## 2023-02-13 DIAGNOSIS — F411 Generalized anxiety disorder: Secondary | ICD-10-CM

## 2023-02-13 DIAGNOSIS — F4010 Social phobia, unspecified: Secondary | ICD-10-CM | POA: Diagnosis not present

## 2023-02-13 MED ORDER — ESCITALOPRAM OXALATE 10 MG PO TABS: 10.0000 mg | ORAL_TABLET | Freq: Every day | ORAL | 1 refills | Status: DC

## 2023-02-13 NOTE — Progress Notes (Signed)
BH MD/PA/NP OP Progress Note  02/14/2023 8:24 PM Cody Weaver  MRN:  [id number]  Chief Complaint:  Chief Complaint  Patient presents with   Follow-up   Medication Refill   HPI:   Cody Weaver is a 27 year old, African-American male with a past psychiatric history significant for generalized anxiety disorder and social anxiety disorder who presents to [facility] for follow and medication management.  Patient is currently being managed on the following medication: Escitalopram 10 mg daily.  Patient presents today encounter stating that he recently applied for school and will be taking online classes for technology.  Patient appears excited about the prospects of school.  Patient reports that he is taking his medication regularly and states that the medication has helped him with talking with other people.  Patient notes that over the years, he has developed trust issues based off of the way people treated him.  Patient has since come to terms with the way he was treated in the past and is moving forward.  Patient denies depression stating that he feels very inspired at this point in his life.  Patient endorses minimal anxiety.  A GAD-7 screen was performed with the patient scoring a 12.  Patient is alert and oriented x 4, calm, cooperative, and fully engaged in conversation during the encounter.  Patient describes his mood as "chilling>" patient denies suicidal or homicidal ideations.  He further denies auditory or visual hallucinations and does not appear to be responding to internal/external stimuli.  Patient endorses good sleep and receives on average 8 hours of sleep each night.  Patient endorses good appetite and eats on average 2 meals per day.  Patient denies alcohol consumption, tobacco use, and illicit drug use.  Visit Diagnosis:    ICD-10-CM   1. Generalized anxiety disorder  F41.1 escitalopram (LEXAPRO) 10 MG tablet    2. Social  anxiety disorder  F40.10 escitalopram (LEXAPRO) 10 MG tablet      Past Psychiatric History:  No diagnoses prior to 07/20/2022   Current diagnoses: Generalized anxiety disorder Social anxiety disorder Agoraphobia  Past Medical History:  Past Medical History:  Diagnosis Date   Obesity    History reviewed. No pertinent surgical history.  Family Psychiatric History:  Patient denies family history of psychiatric illness   Family History: History reviewed. No pertinent family history.  Social History:  Social History   Socioeconomic History   Marital status: Single    Spouse name: Not on file   Number of children: Not on file   Years of education: Not on file   Highest education level: Not on file  Occupational History   Not on file  Tobacco Use   Smoking status: Never   Smokeless tobacco: Never  Substance and Sexual Activity   Alcohol use: Never   Drug use: Never   Sexual activity: Not on file  Other Topics Concern   Not on file  Social History Narrative   Not on file   Social Determinants of Health   Financial Resource Strain: High Risk (08/29/2022)   Overall Financial Resource Strain (CARDIA)    Difficulty of Paying Living Expenses: Hard  Food Insecurity: No Food Insecurity (08/29/2022)   Hunger Vital Sign    Worried About Running Out of Food in the Last Year: Never true    Ran Out of Food in the Last Year: Never true  Transportation Needs: No Transportation Needs (08/29/2022)   PRAPARE - Transportation    Lack  of Transportation (Medical): No    Lack of Transportation (Non-Medical): No  Physical Activity: Insufficiently Active (08/29/2022)   Exercise Vital Sign    Days of Exercise per Week: 3 days    Minutes of Exercise per Session: 30 min  Stress: Stress Concern Present (08/29/2022)   [employer] of Occupational Health - Occupational Stress Questionnaire    Feeling of Stress : Very much  Social Connections: Socially Isolated (08/29/2022)   Social  Connection and Isolation Panel [NHANES]    Frequency of Communication with Friends and Family: More than three times a week    Frequency of Social Gatherings with Friends and Family: Twice a week    Attends Religious Services: Never    Database administrator or Organizations: No    Attends Engineer, structural: Never    Marital Status: Never married    Allergies: No Known Allergies  Metabolic Disorder Labs: No results found for: "HGBA1C", "MPG" No results found for: "PROLACTIN" No results found for: "CHOL", "TRIG", "HDL", "CHOLHDL", "VLDL", "LDLCALC" No results found for: "TSH"  Therapeutic Level Labs: No results found for: "LITHIUM" No results found for: "VALPROATE" No results found for: "CBMZ"  Current Medications: Current Outpatient Medications  Medication Sig Dispense Refill   clindamycin (CLINDAGEL) 1 % gel SMARTSIG:1 Sparingly Topical Twice Daily     dicyclomine (BENTYL) 20 MG tablet Take 1 tablet (20 mg total) by mouth 2 (two) times daily. 20 tablet 0   doxycycline (VIBRA-TABS) 100 MG tablet Take 1 tablet (100 mg total) by mouth 2 (two) times daily. 20 tablet 0   escitalopram (LEXAPRO) 10 MG tablet Take 1 tablet (10 mg total) by mouth daily. 30 tablet 1   hydrOXYzine (ATARAX) 10 MG tablet Take 1 tablet (10 mg total) by mouth 3 (three) times daily as needed. 75 tablet 1   ketoconazole (NIZORAL) 2 % shampoo Apply topically.     omeprazole (PRILOSEC) 20 MG capsule Take 1 capsule (20 mg total) by mouth daily. 30 capsule 1   terbinafine (LAMISIL) 250 MG tablet Take 1 tablet (250 mg total) by mouth daily. 90 tablet 0   Vitamin D, Ergocalciferol, (DRISDOL) 1.25 MG (50000 UNIT) CAPS capsule Take 50,000 Units by mouth once a week.     No current facility-administered medications for this visit.     Musculoskeletal: Strength & Muscle Tone: within normal limits Gait & Station: normal Patient leans: N/A  Psychiatric Specialty Exam: Review of Systems   Psychiatric/Behavioral:  Negative for decreased concentration, dysphoric mood, hallucinations, self-injury, sleep disturbance and suicidal ideas. The patient is not nervous/anxious and is not hyperactive.     There were no vitals taken for this visit.There is no height or weight on file to calculate BMI.  General Appearance: Casual  Eye Contact:  Good  Speech:  Clear and Coherent and Normal Rate  Volume:  Normal  Mood:  Euthymic  Affect:  Appropriate  Thought Process:  Coherent and Descriptions of Associations: Intact  Orientation:  Full (Time, Place, and Person)  Thought Content: WDL   Suicidal Thoughts:  No  Homicidal Thoughts:  No  Memory:  Immediate;   Good Recent;   Good Remote;   Fair  Judgement:  Good  Insight:  Fair  Psychomotor Activity:  Normal  Concentration:  Concentration: Good and Attention Span: Good  Recall:  Good  Fund of Knowledge: Good  Language: Good  Akathisia:  NA  Handed:  Right  AIMS (if indicated): not done  Assets:  Communication Skills  Desire for Improvement Housing Resilience Social Support Vocational/Educational  ADL's:  Intact  Cognition: WNL  Sleep:  Good   Screenings: GAD-7    Flowsheet Row Clinical Support from 02/13/2023 in [facility] Clinical Support from 11/02/2022 in [facility] Counselor from 08/29/2022 in [facility] Office Visit from 07/20/2022 in [facility]  Total GAD-7 Score PHQ2-9    Flowsheet Row Clinical Support from 02/13/2023 in [facility] Clinical Support from 12/14/2022 in [facility] Clinical Support from 11/02/2022 in [facility] Counselor from 08/29/2022 in [facility] Office Visit from 07/20/2022 in [facility]  PHQ-2 Total Score 0 2 0 2 1  PHQ-9  Total Score -- 6 -- 10 --      Flowsheet Row Clinical Support from 02/13/2023 in [facility] Clinical Support from 12/14/2022 in [facility] Clinical Support from 11/02/2022 in [facility]  C-SSRS RISK CATEGORY No Risk No Risk No Risk        Assessment and Plan:   Kayshaun A. Diiorio is a 27 year old, African-American male with a past psychiatric history significant for generalized anxiety disorder and social anxiety disorder who presents to [facility] for follow and medication management.  Patient presents today in a better mood and reports that he recently applied for school and will be taking online courses for technology.  Patient denies depression and endorses minimal anxiety.  Patient would like to continue taking his medication as prescribed.  Patient's medication to be e-prescribed to pharmacy of choice.  Collaboration of Care: Collaboration of Care: Medication Management AEB provider managing patient's psychiatric medications, Psychiatrist AEB patient being followed by mental health provider at this facility, and Referral or follow-up with counselor/therapist AEB patient being seen by a licensed clinical social worker at this facility  Patient/Guardian was advised Release of Information must be obtained prior to any record release in order to collaborate their care with an outside provider. Patient/Guardian was advised if they have not already done so to contact the registration department to sign all necessary forms in order for [place] to release information regarding their care.   Consent: Patient/Guardian gives verbal consent for treatment and assignment of benefits for services provided during this visit. Patient/Guardian expressed understanding and agreed to proceed.   1. Generalized anxiety disorder  - escitalopram (LEXAPRO) 10 MG tablet; Take 1 tablet (10 mg  total) by mouth daily.  Dispense: 30 tablet; Refill: 1  2. Social anxiety disorder  - escitalopram (LEXAPRO) 10 MG tablet; Take 1 tablet (10 mg total) by mouth daily.  Dispense: 30 tablet; Refill: 1  Patient to follow-up in 6 weeks Provider spent a total of 19 minutes with the patient/reviewing patient's chart  Meta Hatchet, PA 02/14/2023, 8:24 PM

## 2023-02-21 ENCOUNTER — Ambulatory Visit (INDEPENDENT_AMBULATORY_CARE_PROVIDER_SITE_OTHER): Payer: Medicaid Other | Admitting: Licensed Clinical Social Worker

## 2023-02-21 DIAGNOSIS — F411 Generalized anxiety disorder: Secondary | ICD-10-CM

## 2023-02-21 DIAGNOSIS — F502 Bulimia nervosa: Secondary | ICD-10-CM

## 2023-02-21 DIAGNOSIS — F4000 Agoraphobia, unspecified: Secondary | ICD-10-CM

## 2023-02-21 NOTE — Progress Notes (Signed)
THERAPIST PROGRESS NOTE  Virtual Visit via Video Note  I connected with [name] on 02/21/23 at  3:00 PM EDT by a video enabled telemedicine application and verified that I am speaking with the correct person using two identifiers.  Location: Patient: [facility]  Provider: Providers Home    I discussed the limitations of evaluation and management by telemedicine and the availability of in person appointments. The patient expressed understanding and agreed to proceed.     I discussed the assessment and treatment plan with the patient. The patient was provided an opportunity to ask questions and all were answered. The patient agreed with the plan and demonstrated an understanding of the instructions.   The patient was advised to call back or seek an in-person evaluation if the symptoms worsen or if the condition fails to improve as anticipated.  I provided 45 minutes of non-face-to-face time during this encounter.   [name], LCSW   Participation Level: Active  Behavioral Response: CasualAlertAnxious  Type of Therapy: Individual Therapy  Treatment Goals addressed:  Active     Anxiety Disorder CCP Problem  1 Agoraphobia       Identify 3 triggers for anxiety  (Progressing)     Start:  08/29/22    Expected End:  07/29/23         workout 3 x weekly  (Progressing)     Start:  08/29/22    Expected End:  07/29/23         no purging 7/7 days weekly  (Progressing)     Start:  08/29/22    Expected End:  07/29/23         maintain 20 hour weekly of employment  (Not Progressing)     Start:  08/29/22    Expected End:  07/29/23         create 3 coping skills for anxiety  (Progressing)     Start:  08/29/22    Expected End:  07/29/23         LTG: Patient will score less than 5 on the Generalized Anxiety Disorder 7 Scale (GAD-7) (Progressing)     Start:  08/29/22    Expected End:  07/29/23       Goal Note     Down from a 12 to 7           STG: Patient will participate in at least 80% of scheduled individual psychotherapy sessions (Progressing)     Start:  08/29/22    Expected End:  07/29/23         STG: Patient will complete at least 80% of assigned homework (Progressing)     Start:  08/29/22    Expected End:  07/29/23            ProgressTowards Goals: Progressing  Interventions: CBT and Motivational Interviewing    Suicidal/Homicidal: Nowithout intent/plan  Therapist Response:    Pt was alert and oriented x 5. He was dressed casually and engaged well in therapy session. He presented with euthymic mood/affect. Aldrick was pleasant, cooperative, and maintained good eye contact.  Pt reported today "everything has been going good". He states he is starting summer classes at [employer] this summer. He wants to pursue a degree in IT. He reports it will take 2 to 3 years. 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 He reports between his health improving and starting school he has remained optimistic. Pt also reports  working on losing weight the proper way without purging and reports no purging in 30 days.  Interventions/Plan: LCSW used psychoanalytic therapy for pt to express thoughts, feeling and emotions. LCSW administered a GAD-7. LCSW review scores with pt. LCSW used supportive therapy for praise and encouragement. LCSW used positive affirmation and reflective listening for motivational interviewing.   Plan: Return again in 3 weeks.  Diagnosis: GAD (generalized anxiety disorder)  Agoraphobia  Bulimia nervosa  Collaboration of Care: Other None today   Patient/Guardian was advised Release of Information must be obtained prior to any record release in order to collaborate their care with an outside provider. Patient/Guardian was advised if they have not already done so to contact the registration department to sign all necessary forms in order for [place] to release information regarding  their care.   Consent: Patient/Guardian gives verbal consent for treatment and assignment of benefits for services provided during this visit. Patient/Guardian expressed understanding and agreed to proceed.   [name], LCSW 02/21/2023

## 2023-03-12 ENCOUNTER — Encounter: Payer: Self-pay | Admitting: Nurse Practitioner

## 2023-03-12 ENCOUNTER — Telehealth: Payer: Medicaid Other | Admitting: Nurse Practitioner

## 2023-03-12 NOTE — Progress Notes (Signed)
No show for Video Visit sent Mychart message to reschedule

## 2023-03-14 ENCOUNTER — Ambulatory Visit (INDEPENDENT_AMBULATORY_CARE_PROVIDER_SITE_OTHER): Payer: Medicaid Other | Admitting: Licensed Clinical Social Worker

## 2023-03-14 DIAGNOSIS — F411 Generalized anxiety disorder: Secondary | ICD-10-CM

## 2023-03-14 DIAGNOSIS — F4000 Agoraphobia, unspecified: Secondary | ICD-10-CM

## 2023-03-14 NOTE — Progress Notes (Signed)
THERAPIST PROGRESS NOTE Virtual Visit via Video Note  I connected with Cody Weaver on 03/14/23 at  1:00 PM EDT by a video enabled telemedicine application and verified that I am speaking with the correct person using two identifiers.  Location: Patient: [facility]  Provider: Provider Home    I discussed the limitations of evaluation and management by telemedicine and the availability of in person appointments. The patient expressed understanding and agreed to proceed.      I discussed the assessment and treatment plan with the patient. The patient was provided an opportunity to ask questions and all were answered. The patient agreed with the plan and demonstrated an understanding of the instructions.   The patient was advised to call back or seek an in-person evaluation if the symptoms worsen or if the condition fails to improve as anticipated.  I provided 45 minutes of non-face-to-face time during this encounter.   [name], LCSW   Participation Level: Active  Behavioral Response: CasualAlertAnxious and Depressed  Type of Therapy: Individual Therapy  Treatment Goals addressed:  Active     Anxiety Disorder CCP Problem  1 Agoraphobia       Identify 3 triggers for anxiety  (Progressing)     Start:  08/29/22    Expected End:  07/29/23       Goal Note     People received perception of him  Social anxiety in bigger grouops          workout 3 x weekly  (Progressing)     Start:  08/29/22    Expected End:  07/29/23         no purging 7/7 days weekly  (Progressing)     Start:  08/29/22    Expected End:  07/29/23       Goal Note     Pt reports no purging in the last 60 days          maintain 20 hour weekly of employment  (Not Progressing)     Start:  08/29/22    Expected End:  07/29/23       Goal Note     Patient to start school instead of gain employment          create 3 coping skills for anxiety  (Progressing)     Start:   08/29/22    Expected End:  07/29/23       Goal Note     Working out          LTG: Patient will score less than 5 on the Generalized Anxiety Disorder 7 Scale (GAD-7) (Progressing)     Start:  08/29/22    Expected End:  07/29/23       Goal Note     Down from a 12 to 7          STG: Patient will participate in at least 80% of scheduled individual psychotherapy sessions (Progressing)     Start:  08/29/22    Expected End:  07/29/23         STG: Patient will complete at least 80% of assigned homework (Progressing)     Start:  08/29/22    Expected End:  07/29/23       Goal Note     Homework today create planner system to help decrease anxiety and be punctual.                ProgressTowards Goals: Progressing  Interventions: CBT, Motivational Interviewing, and  Supportive     Suicidal/Homicidal: Nowithout intent/plan  Therapist Response:     Pt was alert and oriented x 5. He was dressed casually and engaged well in therapy session. He presented with anxious and depressed mood/affect. He was pleasant, cooperative and maintained good eye contact.  Pt showed up late to appointment. LCSW explained policies in place for late and no-show appointment and pt verbalized understanding. Today pt was 16 minutes late and LCSW had to call and remind him of his appointment.  Pt reports tension, worry, and overall increased anxiety because of his poor organization skills such as keeping a timely schedule. LCSW spoke with pt today about organization and utilizing a planner to help decrease anxiety. LCSW and pt spoke today about conflict resolution. Such as picking and choosing battles. LCSW also spoke with pt today about talking with people calmly with low tone of voice to help deescalate situations.  Interventions/Plan: Plan for pt to utilize planner for 4/7 days weekly. LCSW educated pt on nonverbal communication skills. LCSW educated pt on de-escalation. LCSW used psychoanalytic  therapy for pt to express thoughts, feelings, and concerns. LCSW used supportive therapy for praise and encouragement.    Plan: Return again in 3 weeks.  Diagnosis: GAD (generalized anxiety disorder)  Agoraphobia  Collaboration of Care: Other none today   Patient/Guardian was advised Release of Information must be obtained prior to any record release in order to collaborate their care with an outside provider. Patient/Guardian was advised if they have not already done so to contact the registration department to sign all necessary forms in order for [place] to release information regarding their care.   Consent: Patient/Guardian gives verbal consent for treatment and assignment of benefits for services provided during this visit. Patient/Guardian expressed understanding and agreed to proceed.   [name], LCSW 03/14/2023

## 2023-03-29 ENCOUNTER — Telehealth (INDEPENDENT_AMBULATORY_CARE_PROVIDER_SITE_OTHER): Payer: Medicaid Other | Admitting: Physician Assistant

## 2023-03-29 DIAGNOSIS — F4010 Social phobia, unspecified: Secondary | ICD-10-CM | POA: Diagnosis not present

## 2023-03-29 DIAGNOSIS — F411 Generalized anxiety disorder: Secondary | ICD-10-CM | POA: Diagnosis not present

## 2023-04-01 ENCOUNTER — Encounter (HOSPITAL_COMMUNITY): Payer: Self-pay | Admitting: Physician Assistant

## 2023-04-01 MED ORDER — ESCITALOPRAM OXALATE 10 MG PO TABS: 10.0000 mg | ORAL_TABLET | Freq: Every day | ORAL | 2 refills | Status: DC

## 2023-04-01 NOTE — Progress Notes (Signed)
BH MD/PA/NP OP Progress Note  Virtual Visit via Video Note  I connected with Cody Weaver on 04/01/23 at  3:00 PM EDT by a video enabled telemedicine application and verified that I am speaking with the correct person using two identifiers.  Location: Patient: Home Provider: Clinic   I discussed the limitations of evaluation and management by telemedicine and the availability of in person appointments. The patient expressed understanding and agreed to proceed.  Follow Up Instructions:   I discussed the assessment and treatment plan with the patient. The patient was provided an opportunity to ask questions and all were answered. The patient agreed with the plan and demonstrated an understanding of the instructions.   The patient was advised to call back or seek an in-person evaluation if the symptoms worsen or if the condition fails to improve as anticipated.  I provided 34 minutes of non-face-to-face time during this encounter.  Meta Hatchet, PA    04/01/2023 9:46 AM Cody Weaver  MRN:  [id number]  Chief Complaint:  No chief complaint on file.  HPI:   Cody Weaver is a 27 year old,  African-American male with a past psychiatric history significant for generalized anxiety disorder and social anxiety disorder who presents to [facility] for follow and medication management.  Patient is currently being managed on the following medication: Escitalopram 10 mg daily.  Patient reports that his anxiety is still elevated at times and is still unable to deal with people at times.  Patient endorses stressors related to his tinea capitis that he has been struggling with.  He reports that he has been working with a dermatologist that has been listening to his needs and has provided him with medication to help manage his tinea capitis.  Patient states that he has also been trying to lose weight to correct weight and states that he has not  been purging.  He reports that he has been running and eating right and has been restricting his calories to a respectable amount.  Patient also reports that he is super excited about classes.  Patient states that his depression is minimal at this time and believe that the medication is helping.  A PHQ-9 screen was performed with the patient scoring of 13.  A GAD-7 screen was performed with the patient scoring a 14.  Patient is alert and oriented x 4, calm, cooperative, and fully engaged in conversation during the encounter.  Patient endorses calm mood.  Patient denies suicidal or homicidal ideations.  He further denies auditory or visual hallucinations and does not appear to be responding to internal/external stimuli.  Patient does express that he experiences bouts of paranoia due to his anxiety.  Patient endorses good sleep.  Patient endorses fair appetite and eats on average 1 meal per day.  Patient denies alcohol consumption, tobacco use, and illicit drug use.  Visit Diagnosis:    ICD-10-CM   1. Generalized anxiety disorder  F41.1 escitalopram (LEXAPRO) 10 MG tablet    2. Social anxiety disorder  F40.10 escitalopram (LEXAPRO) 10 MG tablet      Past Psychiatric History:  No diagnoses prior to 07/20/2022   Current diagnoses: Generalized anxiety disorder Social anxiety disorder Agoraphobia  Past Medical History:  Past Medical History:  Diagnosis Date   Obesity    History reviewed. No pertinent surgical history.  Family Psychiatric History:  Patient denies family history of psychiatric illness   Family History: History reviewed. No pertinent family history.  Social History:  Social History   Socioeconomic History   Marital status: Single    Spouse name: Not on file   Number of children: Not on file   Years of education: Not on file   Highest education level: Not on file  Occupational History   Not on file  Tobacco Use   Smoking status: Never   Smokeless tobacco: Never   Substance and Sexual Activity   Alcohol use: Never   Drug use: Never   Sexual activity: Not on file  Other Topics Concern   Not on file  Social History Narrative   Not on file   Social Determinants of Health   Financial Resource Strain: High Risk (08/29/2022)   Overall Financial Resource Strain (CARDIA)    Difficulty of Paying Living Expenses: Hard  Food Insecurity: No Food Insecurity (08/29/2022)   Hunger Vital Sign    Worried About Running Out of Food in the Last Year: Never true    Ran Out of Food in the Last Year: Never true  Transportation Needs: No Transportation Needs (08/29/2022)   PRAPARE - Administrator, Civil Service (Medical): No    Lack of Transportation (Non-Medical): No  Physical Activity: Insufficiently Active (08/29/2022)   Exercise Vital Sign    Days of Exercise per Week: 3 days    Minutes of Exercise per Session: 30 min  Stress: Stress Concern Present (08/29/2022)   [employer] of Occupational Health - Occupational Stress Questionnaire    Feeling of Stress : Very much  Social Connections: Socially Isolated (08/29/2022)   Social Connection and Isolation Panel [NHANES]    Frequency of Communication with Friends and Family: More than three times a week    Frequency of Social Gatherings with Friends and Family: Twice a week    Attends Religious Services: Never    Database administrator or Organizations: No    Attends Engineer, structural: Never    Marital Status: Never married    Allergies: No Known Allergies  Metabolic Disorder Labs: No results found for: "HGBA1C", "MPG" No results found for: "PROLACTIN" No results found for: "CHOL", "TRIG", "HDL", "CHOLHDL", "VLDL", "LDLCALC" No results found for: "TSH"  Therapeutic Level Labs: No results found for: "LITHIUM" No results found for: "VALPROATE" No results found for: "CBMZ"  Current Medications: Current Outpatient Medications  Medication Sig Dispense Refill   clindamycin  (CLINDAGEL) 1 % gel SMARTSIG:1 Sparingly Topical Twice Daily     dicyclomine (BENTYL) 20 MG tablet Take 1 tablet (20 mg total) by mouth 2 (two) times daily. 20 tablet 0   doxycycline (VIBRA-TABS) 100 MG tablet Take 1 tablet (100 mg total) by mouth 2 (two) times daily. 20 tablet 0   escitalopram (LEXAPRO) 10 MG tablet Take 1 tablet (10 mg total) by mouth daily. 30 tablet 2   hydrOXYzine (ATARAX) 10 MG tablet Take 1 tablet (10 mg total) by mouth 3 (three) times daily as needed. 75 tablet 1   ketoconazole (NIZORAL) 2 % shampoo Apply topically.     omeprazole (PRILOSEC) 20 MG capsule Take 1 capsule (20 mg total) by mouth daily. 30 capsule 1   terbinafine (LAMISIL) 250 MG tablet Take 1 tablet (250 mg total) by mouth daily. 90 tablet 0   Vitamin D, Ergocalciferol, (DRISDOL) 1.25 MG (50000 UNIT) CAPS capsule Take 50,000 Units by mouth once a week.     No current facility-administered medications for this visit.     Musculoskeletal: Strength & Muscle Tone: within normal limits Gait &  Station: normal Patient leans: N/A  Psychiatric Specialty Exam: Review of Systems  Psychiatric/Behavioral:  Negative for decreased concentration, dysphoric mood, hallucinations, self-injury, sleep disturbance and suicidal ideas. The patient is not nervous/anxious and is not hyperactive.     There were no vitals taken for this visit.There is no height or weight on file to calculate BMI.  General Appearance: Casual  Eye Contact:  Good  Speech:  Clear and Coherent and Normal Rate  Volume:  Normal  Mood:  Euthymic  Affect:  Appropriate  Thought Process:  Coherent and Descriptions of Associations: Intact  Orientation:  Full (Time, Place, and Person)  Thought Content: WDL   Suicidal Thoughts:  No  Homicidal Thoughts:  No  Memory:  Immediate;   Good Recent;   Good Remote;   Fair  Judgement:  Good  Insight:  Fair  Psychomotor Activity:  Normal  Concentration:  Concentration: Good and Attention Span: Good   Recall:  Good  Fund of Knowledge: Good  Language: Good  Akathisia:  NA  Handed:  Right  AIMS (if indicated): not done  Assets:  Communication Skills Desire for Improvement Housing Resilience Social Support Vocational/Educational  ADL's:  Intact  Cognition: WNL  Sleep:  Good   Screenings: GAD-7    Flowsheet Row Video Visit from 03/29/2023 in [facility] Counselor from 02/21/2023 in [facility] Clinical Support from 02/13/2023 in [facility] Clinical Support from 11/02/2022 in [facility] Counselor from 08/29/2022 in [facility]  Total GAD-7 Score 14 7 12 11 12       PHQ2-9    Flowsheet Row Video Visit from 03/29/2023 in [facility] Clinical Support from 02/13/2023 in [facility] Clinical Support from 12/14/2022 in [facility] Clinical Support from 11/02/2022 in [facility] Counselor from 08/29/2022 in [facility]  PHQ-2 Total Score 3 0 2 0 2  PHQ-9 Total Score 13 -- 6 -- 10      Flowsheet Row Video Visit from 03/29/2023 in [facility] Clinical Support from 02/13/2023 in [facility] Clinical Support from 12/14/2022 in [facility]  C-SSRS RISK CATEGORY No Risk No Risk No Risk        Assessment and Plan:   Orell A. Lauersdorf is a 27 year old, African-American male with a past psychiatric history significant for generalized anxiety disorder and social anxiety disorder who presents to [facility] via virtual video visit for follow and medication management.  Patient reports that he has still been experiencing some anxiety related to stressors in his life.  He states that his depression  has been minimal and states that his medications have not been effective in managing both his anxiety and depression.  It appears patient's anxiety stems from stressors in his life and health.  Patient reports that he is stable on his current medication regimen and will continue taking his medication as prescribed.  Patient's medication to be e-prescribed to pharmacy of choice.  Collaboration of Care: Collaboration of Care: Medication Management AEB provider managing patient's psychiatric medications, Psychiatrist AEB patient being followed by mental health provider at this facility, and Referral or follow-up with counselor/therapist AEB patient being seen by a licensed clinical social worker at this facility  Patient/Guardian was advised Release of Information must be obtained prior to any record release  in order to collaborate their care with an outside provider. Patient/Guardian was advised if they have not already done so to contact the registration department to sign all necessary forms in order for [place] to release information regarding their care.   Consent: Patient/Guardian gives verbal consent for treatment and assignment of benefits for services provided during this visit. Patient/Guardian expressed understanding and agreed to proceed.   1. Generalized anxiety disorder  - escitalopram (LEXAPRO) 10 MG tablet; Take 1 tablet (10 mg total) by mouth daily.  Dispense: 30 tablet; Refill: 2  2. Social anxiety disorder  - escitalopram (LEXAPRO) 10 MG tablet; Take 1 tablet (10 mg total) by mouth daily.  Dispense: 30 tablet; Refill: 2  Patient to follow-up in 2 months Provider spent a total of 34 minutes with the patient/reviewing patient's chart  Meta Hatchet, PA 04/01/2023, 9:46 AM

## 2023-04-04 ENCOUNTER — Telehealth: Payer: Medicaid Other | Admitting: Physician Assistant

## 2023-04-04 ENCOUNTER — Telehealth: Payer: Medicaid Other

## 2023-04-04 ENCOUNTER — Ambulatory Visit (INDEPENDENT_AMBULATORY_CARE_PROVIDER_SITE_OTHER): Payer: Medicaid Other | Admitting: Licensed Clinical Social Worker

## 2023-04-04 DIAGNOSIS — L989 Disorder of the skin and subcutaneous tissue, unspecified: Secondary | ICD-10-CM

## 2023-04-04 DIAGNOSIS — F411 Generalized anxiety disorder: Secondary | ICD-10-CM | POA: Diagnosis not present

## 2023-04-04 DIAGNOSIS — L659 Nonscarring hair loss, unspecified: Secondary | ICD-10-CM

## 2023-04-04 DIAGNOSIS — F4000 Agoraphobia, unspecified: Secondary | ICD-10-CM

## 2023-04-04 DIAGNOSIS — F6381 Intermittent explosive disorder: Secondary | ICD-10-CM | POA: Insufficient documentation
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
## 2023-04-04 NOTE — Progress Notes (Signed)
Virtual Visit Consent   Cody Weaver, you are scheduled for a virtual visit with a [place] provider today. Just as with appointments in the office, your consent must be obtained to participate. Your consent will be active for this visit and any virtual visit you may have with one of our providers in the next 365 days. If you have a MyChart account, a copy of this consent can be sent to you electronically.  As this is a virtual visit, video technology does not allow for your provider to perform a traditional examination. This may limit your provider's ability to fully assess your condition. If your provider identifies any concerns that need to be evaluated in person or the need to arrange testing (such as labs, EKG, etc.), we will make arrangements to do so. Although advances in technology are sophisticated, we cannot ensure that it will always work on either your end or our end. If the connection with a video visit is poor, the visit may have to be switched to a telephone visit. With either a video or telephone visit, we are not always able to ensure that we have a secure connection.  By engaging in this virtual visit, you consent to the provision of healthcare and authorize for your insurance to be billed (if applicable) for the services provided during this visit. Depending on your insurance coverage, you may receive a charge related to this service.  I need to obtain your verbal consent now. Are you willing to proceed with your visit today? KIPPY BUTTA has provided verbal consent on 04/04/2023 for a virtual visit (video or telephone). Piedad Climes, [place]  Date: 04/04/2023 7:35 PM  Virtual Visit via Video Note   I, Piedad Climes, connected with  [name]  ([id number], 08/10/1996) on 04/04/23 at  7:15 PM EDT by a video-enabled telemedicine application and verified that I am speaking with the correct person using two identifiers.  Location: Patient: Virtual Visit  Location Patient: Home Provider: Virtual Visit Location Provider: Home Office   I discussed the limitations of evaluation and management by telemedicine and the availability of in person appointments. The patient expressed understanding and agreed to proceed.    History of Present Illness: Cody Weaver is a 27 y.o. who identifies as a male who was assigned male at birth, and is being seen today for ongoing issues with his scalp. Notes having issue for the past > 1 year. Initially diagnosed with tinea capitis and put on ketoconazole shampoo followed by 3+ months of oral antifungal with improvement but not resolution. Also with development of a pilar cyst that was treated with antibiotics and then subsequently evaluated by Dermatology with patient reported drainage and injections in the area with shrinkage of the cyst. Over past several weeks has noted recurrent of this bump along with others like it, under the skin, hard and tender without drainage. Still noting hair loss and wanting further evaluation and management. Has not reached back out to his PCP or Dermatologist.    HPI: HPI  Problems:  Patient Active Problem List   Diagnosis Date Noted   Intermittent explosive disorder in adult 04/04/2023   Agoraphobia 08/29/2022   Bulimia nervosa 08/29/2022   GAD (generalized anxiety disorder) 07/20/2022    Allergies: No Known Allergies Medications:  Current Outpatient Medications:    escitalopram (LEXAPRO) 10 MG tablet, Take 1 tablet (10 mg total) by mouth daily., Disp: 30 tablet, Rfl: 2   hydrOXYzine (ATARAX) 10 MG  tablet, Take 1 tablet (10 mg total) by mouth 3 (three) times daily as needed., Disp: 75 tablet, Rfl: 1   omeprazole (PRILOSEC) 20 MG capsule, Take 1 capsule (20 mg total) by mouth daily., Disp: 30 capsule, Rfl: 1   Vitamin D, Ergocalciferol, (DRISDOL) 1.25 MG (50000 UNIT) CAPS capsule, Take 50,000 Units by mouth once a week., Disp: , Rfl:   Observations/Objective:  No labored  breathing. Speech is clear and coherent with logical content.  Patient is alert and oriented at baseline.  Patient unable to get camera to stay on for assessment of the areas of concern.   Assessment and Plan: 1. Hair loss  2. Lesion of skin of scalp  Ongoing with recurrence of hard lesions under the skin. Unable to fully assess given lack of video. Needs in person evaluation with his Dermatologist. He has been instructed to reach back out to them for further evaluation and management. No charge.   Follow Up Instructions: I discussed the assessment and treatment plan with the patient. The patient was provided an opportunity to ask questions and all were answered. The patient agreed with the plan and demonstrated an understanding of the instructions.  A copy of instructions were sent to the patient via MyChart unless otherwise noted below.   The patient was advised to call back or seek an in-person evaluation if the symptoms worsen or if the condition fails to improve as anticipated.  Time:  I spent 10 minutes with the patient via telehealth technology discussing the above problems/concerns.    Piedad Climes, PA-C

## 2023-04-04 NOTE — Patient Instructions (Signed)
  [name], thank you for joining Piedad Climes, PA-C for today's virtual visit.  While this provider is not your primary care provider (PCP), if your PCP is located in our provider database this encounter information will be shared with them immediately following your visit.   A [facility] MyChart account gives you access to today's visit and all your visits, tests, and labs performed at [facility] " click here if you don't have a [facility] MyChart account or go to [email or web address]  Consent: (Patient) [name] provided verbal consent for this virtual visit at the beginning of the encounter.  Current Medications:  Current Outpatient Medications:    clindamycin (CLINDAGEL) 1 % gel, SMARTSIG:1 Sparingly Topical Twice Daily, Disp: , Rfl:    dicyclomine (BENTYL) 20 MG tablet, Take 1 tablet (20 mg total) by mouth 2 (two) times daily., Disp: 20 tablet, Rfl: 0   doxycycline (VIBRA-TABS) 100 MG tablet, Take 1 tablet (100 mg total) by mouth 2 (two) times daily., Disp: 20 tablet, Rfl: 0   escitalopram (LEXAPRO) 10 MG tablet, Take 1 tablet (10 mg total) by mouth daily., Disp: 30 tablet, Rfl: 2   hydrOXYzine (ATARAX) 10 MG tablet, Take 1 tablet (10 mg total) by mouth 3 (three) times daily as needed., Disp: 75 tablet, Rfl: 1   ketoconazole (NIZORAL) 2 % shampoo, Apply topically., Disp: , Rfl:    omeprazole (PRILOSEC) 20 MG capsule, Take 1 capsule (20 mg total) by mouth daily., Disp: 30 capsule, Rfl: 1   terbinafine (LAMISIL) 250 MG tablet, Take 1 tablet (250 mg total) by mouth daily., Disp: 90 tablet, Rfl: 0   Vitamin D, Ergocalciferol, (DRISDOL) 1.25 MG (50000 UNIT) CAPS capsule, Take 50,000 Units by mouth once a week., Disp: , Rfl:    Medications ordered in this encounter:  No orders of the defined types were placed in this encounter.    *If you need refills on other medications prior to your next appointment, please contact your  pharmacy*  Follow-Up: Call back or seek an in-person evaluation if the symptoms worsen or if the condition fails to improve as anticipated.  [facility] [phone]  Other Instructions Please contact your previous Dermatologist for further evaluation and ongoing management.  If they are unable to see you or you are wanting to see a different provider, please let your PCP know so they can place a new referral. I do recommend [facility].   If you have been instructed to have an in-person evaluation today at a local Urgent Care facility, please use the link below. It will take you to a list of all of our available [facility], including address, phone number and hours of operation. Please do not delay care.  [facility]  If you or a family member do not have a primary care provider, use the link below to schedule a visit and establish care. When you choose a [facility] primary care physician or advanced practice provider, you gain a long-term partner in health. Find a Primary Care Provider  Learn more about [facility]'s in-office and virtual care options:  - Get Care Now

## 2023-04-22 ENCOUNTER — Ambulatory Visit (INDEPENDENT_AMBULATORY_CARE_PROVIDER_SITE_OTHER): Payer: Medicaid Other | Admitting: Licensed Clinical Social Worker

## 2023-04-22 DIAGNOSIS — F4000 Agoraphobia, unspecified: Secondary | ICD-10-CM | POA: Diagnosis not present

## 2023-04-22 DIAGNOSIS — F411 Generalized anxiety disorder: Secondary | ICD-10-CM

## 2023-04-22 DIAGNOSIS — F502 Bulimia nervosa: Secondary | ICD-10-CM | POA: Diagnosis not present

## 2023-04-22 NOTE — Progress Notes (Signed)
THERAPIST PROGRESS NOTE  Virtual Visit via Video Note  I connected with Cody Weaver on 04/22/23 at  2:00 PM EDT by a video enabled telemedicine application and verified that I am speaking with the correct person using two identifiers.  Location: Patient: [facility]  Provider: Providers Home    I discussed the limitations of evaluation and management by telemedicine and the availability of in person appointments. The patient expressed understanding and agreed to proceed.     I discussed the assessment and treatment plan with the patient. The patient was provided an opportunity to ask questions and all were answered. The patient agreed with the plan and demonstrated an understanding of the instructions.   The patient was advised to call back or seek an in-person evaluation if the symptoms worsen or if the condition fails to improve as anticipated.  I provided 60 minutes of non-face-to-face time during this encounter.   [name], LCSW   Participation Level: Active  Behavioral Response: CasualAlertAnxious and Depressed  Type of Therapy: Individual Therapy  Treatment Goals addressed:  Active     Anxiety Disorder CCP Problem  1 Agoraphobia       Identify 3 triggers for anxiety  (Progressing)     Start:  08/29/22    Expected End:  07/29/23         workout 3 x weekly  (Progressing)     Start:  08/29/22    Expected End:  07/29/23         no purging 7/7 days weekly  (Progressing)     Start:  08/29/22    Expected End:  07/29/23         maintain 20 hour weekly of employment  (Not Progressing)     Start:  08/29/22    Expected End:  07/29/23       Goal Note     Pt has priotized getting back in school          create 3 coping skills for anxiety  (Progressing)     Start:  08/29/22    Expected End:  07/29/23       Goal Note     Working out          LTG: Patient will score less than 5 on the Generalized Anxiety Disorder 7 Scale (GAD-7)  (Progressing)     Start:  08/29/22    Expected End:  07/29/23       Goal Note     Down from a 12 to 7          STG: Patient will participate in at least 80% of scheduled individual psychotherapy sessions (Progressing)     Start:  08/29/22    Expected End:  07/29/23         STG: Patient will complete at least 80% of assigned homework (Progressing)     Start:  08/29/22    Expected End:  07/29/23           Active     Anxiety Disorder CCP Problem  1 Agoraphobia       Identify 3 triggers for anxiety  (Progressing)     Start:  08/29/22    Expected End:  07/29/23         workout 3 x weekly  (Progressing)     Start:  08/29/22    Expected End:  07/29/23         no purging 7/7 days weekly  (Progressing)  Start:  08/29/22    Expected End:  07/29/23         maintain 20 hour weekly of employment  (Not Progressing)     Start:  08/29/22    Expected End:  07/29/23       Goal Note     Pt has priotized getting back in school          create 3 coping skills for anxiety  (Progressing)     Start:  08/29/22    Expected End:  07/29/23       Goal Note     Working out          LTG: Patient will score less than 5 on the Generalized Anxiety Disorder 7 Scale (GAD-7) (Progressing)     Start:  08/29/22    Expected End:  07/29/23       Goal Note     Down from a 12 to 7          STG: Patient will participate in at least 80% of scheduled individual psychotherapy sessions (Progressing)     Start:  08/29/22    Expected End:  07/29/23         STG: Patient will complete at least 80% of assigned homework (Progressing)     Start:  08/29/22    Expected End:  07/29/23           Active     Anxiety Disorder CCP Problem  1 Agoraphobia       Identify 3 triggers for anxiety  (Progressing)     Start:  08/29/22    Expected End:  07/29/23         workout 3 x weekly  (Progressing)     Start:  08/29/22    Expected End:  07/29/23         no purging 7/7 days weekly   (Progressing)     Start:  08/29/22    Expected End:  07/29/23         maintain 20 hour weekly of employment  (Not Progressing)     Start:  08/29/22    Expected End:  07/29/23       Goal Note     Pt has priotized getting back in school          create 3 coping skills for anxiety  (Progressing)     Start:  08/29/22    Expected End:  07/29/23       Goal Note     Working out          LTG: Patient will score less than 5 on the Generalized Anxiety Disorder 7 Scale (GAD-7) (Progressing)     Start:  08/29/22    Expected End:  07/29/23       Goal Note     Down from a 12 to 7          STG: Patient will participate in at least 80% of scheduled individual psychotherapy sessions (Progressing)     Start:  08/29/22    Expected End:  07/29/23         STG: Patient will complete at least 80% of assigned homework (Progressing)     Start:  08/29/22    Expected End:  07/29/23            ProgressTowards Goals: Progressing  Interventions: CBT, Motivational Interviewing, and Supportive  Suicidal/Homicidal: Nowithout intent/plan  Therapist Response:   Plan: Return again in 3 weeks.  Diagnosis: Agoraphobia  GAD (generalized anxiety disorder)  Bulimia nervosa  Collaboration of Care: Other none today  Patient/Guardian was advised Release of Information must be obtained prior to any record release in order to collaborate their care with an outside provider. Patient/Guardian was advised if they have not already done so to contact the registration department to sign all necessary forms in order for [place] to release information regarding their care.   Consent: Patient/Guardian gives verbal consent for treatment and assignment of benefits for services provided during this visit. Patient/Guardian expressed understanding and agreed to proceed.   [name], LCSW 04/22/2023

## 2023-05-16 ENCOUNTER — Ambulatory Visit (INDEPENDENT_AMBULATORY_CARE_PROVIDER_SITE_OTHER): Payer: Medicaid Other | Admitting: Licensed Clinical Social Worker

## 2023-05-16 DIAGNOSIS — F411 Generalized anxiety disorder: Secondary | ICD-10-CM

## 2023-05-16 NOTE — Progress Notes (Unsigned)
THERAPIST PROGRESS NOTE  Virtual Visit via Video Note  I connected with Cody Weaver on 05/16/23 at  4:00 PM EDT by a video enabled telemedicine application and verified that I am speaking with the correct person using two identifiers.  Location: Patient: [facility]  Provider: Provider Home    I discussed the limitations of evaluation and management by telemedicine and the availability of in person appointments. The patient expressed understanding and agreed to proceed.   I discussed the assessment and treatment plan with the patient. The patient was provided an opportunity to ask questions and all were answered. The patient agreed with the plan and demonstrated an understanding of the instructions.   The patient was advised to call back or seek an in-person evaluation if the symptoms worsen or if the condition fails to improve as anticipated.  I provided 30 minutes of non-face-to-face time during this encounter.   [name], LCSW   Participation Level: Active  Behavioral Response: CasualAlertAnxious and Depressed  Type of Therapy: Individual Therapy  Treatment Goals addressed:  Active     Anxiety Disorder CCP Problem  1 Agoraphobia       Identify 3 triggers for anxiety  (Progressing)     Start:  08/29/22    Expected End:  07/29/23       Goal Note     Weight/self image          workout 3 x weekly  (Progressing)     Start:  08/29/22    Expected End:  07/29/23       Goal Note     Pt self reporting working out 3 x weekly          no purging 7/7 days weekly  (Progressing)     Start:  08/29/22    Expected End:  07/29/23       Goal Note     Pt self reports no purging          maintain 20 hour weekly of employment  (Not Progressing)     Start:  08/29/22    Expected End:  07/29/23       Goal Note     Pt has priotized getting back in school          create 3 coping skills for anxiety  (Progressing)     Start:  08/29/22     Expected End:  07/29/23       Goal Note     Exercise          LTG: Patient will score less than 5 on the Generalized Anxiety Disorder 7 Scale (GAD-7) (Progressing)     Start:  08/29/22    Expected End:  07/29/23       Goal Note     Down from a 12 to 7          STG: Patient will participate in at least 80% of scheduled individual psychotherapy sessions (Progressing)     Start:  08/29/22    Expected End:  07/29/23         STG: Patient will complete at least 80% of assigned homework (Progressing)     Start:  08/29/22    Expected End:  07/29/23         Review results of GAD-7 with the patient to track progress     Start:  08/29/22         Discuss risks and benefits of medication treatment options for this problem  and prescribe as indicated (Completed)     Start:  08/29/22    End:  10/02/22      Encourage patient to take psychotropic medication as prescribed (Completed)     Start:  08/29/22    End:  10/02/22      Work with patient to track symptoms, triggers and/or skill use through a mood chart, diary card, or journal (Completed)     Start:  08/29/22    End:  11/06/22      Perform psychoeducation regarding anxiety disorders (Completed)     Start:  08/29/22    End:  10/02/22      Provide patient with educational information and reading material on anxiety, its causes, and symptoms (Completed)     Start:  08/29/22    End:  11/06/22      Work with patient to identify the major components of a recent episode of anxiety: physical symptoms, major thoughts and images, and major behaviors they experienced (Completed)     Start:  08/29/22    End:  11/06/22         ProgressTowards Goals: Progressing  Interventions: CBT, Motivational Interviewing, and Supportive    Suicidal/Homicidal: Nowithout intent/plan  Therapist Response:     Patient was alert and oriented x 5.  Patient was pleasant, cooperative, maintained good eye contact.  He engaged well in  therapy session was dressed casually.  He presented today with anxious mood\affect.  Patient reports primary stressors as school and work.  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  Patient does not know what he wants to pursue in IT and has been talking to some of his instructors to degrees and certifications he can get.  Patient reports that he is trying to get an internship to better educate himself and gaining experience on the profession.  Patient reports that he is also working hard to lose weight.  Patient states that he has lost 20 pounds.  Patient reports that he is working out 3 times weekly.  Intervention/Plan: LCSW psychoanalytic therapy for patient to express thoughts, feelings and concerns utilizing 3 Association.  LCSW supportive therapy for praise and encouragement.  LCSW used reframing for cognitive behavioral therapy.  LCSW educated patient on the benefits of exercise and how it can decrease anxiety and depression.  LCSW educated patient on the signs and symptoms of anxiety.  Plan: Return again in 3 weeks.  Diagnosis: GAD (generalized anxiety disorder)  Collaboration of Care: Other None today   Patient/Guardian was advised Release of Information must be obtained prior to any record release in order to collaborate their care with an outside provider. Patient/Guardian was advised if they have not already done so to contact the registration department to sign all necessary forms in order for [place] to release information regarding their care.   Consent: Patient/Guardian gives verbal consent for treatment and assignment of benefits for services provided during this visit. Patient/Guardian expressed understanding and agreed to proceed.   [name], LCSW 05/16/2023

## 2023-05-17 ENCOUNTER — Telehealth (INDEPENDENT_AMBULATORY_CARE_PROVIDER_SITE_OTHER): Payer: Medicaid Other | Admitting: Physician Assistant

## 2023-05-17 ENCOUNTER — Encounter (HOSPITAL_COMMUNITY): Payer: Self-pay | Admitting: Physician Assistant

## 2023-05-17 DIAGNOSIS — F411 Generalized anxiety disorder: Secondary | ICD-10-CM | POA: Diagnosis not present

## 2023-05-17 DIAGNOSIS — F4010 Social phobia, unspecified: Secondary | ICD-10-CM

## 2023-05-17 MED ORDER — ESCITALOPRAM OXALATE 10 MG PO TABS: 10.0000 mg | ORAL_TABLET | Freq: Every day | ORAL | 2 refills | Status: DC

## 2023-05-17 NOTE — Progress Notes (Unsigned)
BH MD/PA/NP OP Progress Note  Virtual Visit via Video Note  I connected with Cody Weaver on 05/17/23 at 10:00 AM EDT by a video enabled telemedicine application and verified that I am speaking with the correct person using two identifiers.  Location: Patient: Home Provider: Clinic   I discussed the limitations of evaluation and management by telemedicine and the availability of in person appointments. The patient expressed understanding and agreed to proceed.  Follow Up Instructions:   I discussed the assessment and treatment plan with the patient. The patient was provided an opportunity to ask questions and all were answered. The patient agreed with the plan and demonstrated an understanding of the instructions.   The patient was advised to call back or seek an in-person evaluation if the symptoms worsen or if the condition fails to improve as anticipated.  I provided 40 minutes of non-face-to-face time during this encounter.  Meta Hatchet, PA    05/17/2023 7:11 PM Cody Weaver  MRN:  [id number]  Chief Complaint:  Chief Complaint  Patient presents with   Follow-up   Medication Refill   HPI:   Cody Weaver is a 27 year old, African-American male with a past psychiatric history significant for generalized anxiety disorder and social anxiety disorder who presents to [facility] for follow and medication management.  Patient is currently being managed on the following medication: Escitalopram 10 mg daily.  Patient presents to the encounter stating that he has been doing well.  Patient reports that he has been working out and not eating as much and effort to lose weight.  He states that life has been decent and he is currently working towards an internship at school.  Patient appears goal-oriented and expresses that he would like to pursue a career in IT.  Patient reports that he continues to stressed out about normal  things in his life and is worried about the future at times.  Patient reports that he is afraid that everything that he is working towards will amount to nothing.  Patient denies depressive symptoms and further denies symptoms of mania such as increased energy or feeling on top of the world.  Patient continues to endorse anxiety but states that it is manageable at this time.  He reports that his medications have been helpful in managing his anxiety.  A PHQ-9 screen was performed with the patient scoring a 3.  A GAD-7 screen was also performed with the patient scoring a 12.  Patient is alert and oriented x 4, calm, cooperative, and fully engaged in conversation during the encounter.  Patient describes his mood as school.  Patient denies suicidal or homicidal ideations.  He further denies auditory or visual hallucinations and does not appear to be responding to internal/external stimuli.  Patient endorses good sleep and receives on average 7 hours of sleep per night.  Patient endorses decreased appetite and eats on average 1 meal per day.  Patient denies alcohol consumption, tobacco use, and illicit drug use.  Visit Diagnosis:    ICD-10-CM   1. Generalized anxiety disorder  F41.1 escitalopram (LEXAPRO) 10 MG tablet    2. Social anxiety disorder  F40.10 escitalopram (LEXAPRO) 10 MG tablet      Past Psychiatric History:  No diagnoses prior to 07/20/2022   Current diagnoses: Generalized anxiety disorder Social anxiety disorder Agoraphobia  Past Medical History:  Past Medical History:  Diagnosis Date   Obesity    History reviewed. No pertinent surgical history.  Family Psychiatric History:  Patient denies family history of psychiatric illness   Family History: History reviewed. No pertinent family history.  Social History:  Social History   Socioeconomic History   Marital status: Single    Spouse name: Not on file   Number of children: Not on file   Years of education: Not on file    Highest education level: Not on file  Occupational History   Not on file  Tobacco Use   Smoking status: Never   Smokeless tobacco: Never  Substance and Sexual Activity   Alcohol use: Never   Drug use: Never   Sexual activity: Not on file  Other Topics Concern   Not on file  Social History Narrative   Not on file   Social Determinants of Health   Financial Resource Strain: High Risk (08/29/2022)   Overall Financial Resource Strain (CARDIA)    Difficulty of Paying Living Expenses: Hard  Food Insecurity: No Food Insecurity (08/29/2022)   Hunger Vital Sign    Worried About Running Out of Food in the Last Year: Never true    Ran Out of Food in the Last Year: Never true  Transportation Needs: No Transportation Needs (08/29/2022)   PRAPARE - Administrator, Civil Service (Medical): No    Lack of Transportation (Non-Medical): No  Physical Activity: Insufficiently Active (08/29/2022)   Exercise Vital Sign    Days of Exercise per Week: 3 days    Minutes of Exercise per Session: 30 min  Stress: Stress Concern Present (08/29/2022)   [employer] of Occupational Health - Occupational Stress Questionnaire    Feeling of Stress : Very much  Social Connections: Socially Isolated (08/29/2022)   Social Connection and Isolation Panel [NHANES]    Frequency of Communication with Friends and Family: More than three times a week    Frequency of Social Gatherings with Friends and Family: Twice a week    Attends Religious Services: Never    Database administrator or Organizations: No    Attends Engineer, structural: Never    Marital Status: Never married    Allergies: No Known Allergies  Metabolic Disorder Labs: No results found for: "HGBA1C", "MPG" No results found for: "PROLACTIN" No results found for: "CHOL", "TRIG", "HDL", "CHOLHDL", "VLDL", "LDLCALC" No results found for: "TSH"  Therapeutic Level Labs: No results found for: "LITHIUM" No results found for:  "VALPROATE" No results found for: "CBMZ"  Current Medications: Current Outpatient Medications  Medication Sig Dispense Refill   escitalopram (LEXAPRO) 10 MG tablet Take 1 tablet (10 mg total) by mouth daily. 30 tablet 2   hydrOXYzine (ATARAX) 10 MG tablet Take 1 tablet (10 mg total) by mouth 3 (three) times daily as needed. 75 tablet 1   omeprazole (PRILOSEC) 20 MG capsule Take 1 capsule (20 mg total) by mouth daily. 30 capsule 1   Vitamin D, Ergocalciferol, (DRISDOL) 1.25 MG (50000 UNIT) CAPS capsule Take 50,000 Units by mouth once a week.     No current facility-administered medications for this visit.     Musculoskeletal: Strength & Muscle Tone: within normal limits Gait & Station: normal Patient leans: N/A  Psychiatric Specialty Exam: Review of Systems  Psychiatric/Behavioral:  Negative for decreased concentration, dysphoric mood, hallucinations, self-injury, sleep disturbance and suicidal ideas. The patient is nervous/anxious. The patient is not hyperactive.     There were no vitals taken for this visit.There is no height or weight on file to calculate BMI.  General Appearance: Casual  Eye Contact:  Good  Speech:  Clear and Coherent and Normal Rate  Volume:  Normal  Mood:  Anxious  Affect:  Appropriate  Thought Process:  Coherent and Descriptions of Associations: Intact  Orientation:  Full (Time, Place, and Person)  Thought Content: WDL   Suicidal Thoughts:  No  Homicidal Thoughts:  No  Memory:  Immediate;   Good Recent;   Good Remote;   Fair  Judgement:  Good  Insight:  Fair  Psychomotor Activity:  Normal  Concentration:  Concentration: Good and Attention Span: Good  Recall:  Good  Fund of Knowledge: Good  Language: Good  Akathisia:  NA  Handed:  Right  AIMS (if indicated): not done  Assets:  Communication Skills Desire for Improvement Housing Resilience Social Support Vocational/Educational  ADL's:  Intact  Cognition: WNL  Sleep:  Good    Screenings: GAD-7    Flowsheet Row Video Visit from 05/17/2023 in [facility] Video Visit from 03/29/2023 in [facility] Counselor from 02/21/2023 in [facility] Clinical Support from 02/13/2023 in [facility] Clinical Support from 11/02/2022 in [facility]  Total GAD-7 Score 12 14 7 12 11       PHQ2-9    Flowsheet Row Video Visit from 05/17/2023 in [facility] Video Visit from 03/29/2023 in [facility] Clinical Support from 02/13/2023 in [facility] Clinical Support from 12/14/2022 in [facility] Clinical Support from 11/02/2022 in [facility]  PHQ-2 Total Score 2 3 0 2 0  PHQ-9 Total Score 3 13 -- 6 --      Flowsheet Row Video Visit from 05/17/2023 in [facility] Video Visit from 03/29/2023 in [facility] Clinical Support from 02/13/2023 in [facility]  C-SSRS RISK CATEGORY No Risk No Risk No Risk        Assessment and Plan:   Cody Weaver is a 27 year old, African-American male with a past psychiatric history significant for generalized anxiety disorder and social anxiety disorder who presents to [facility] via virtual video visit for follow and medication management.  Patient presents to the encounter stating that he has been doing well.  Patient appears goal oriented expressing that he would like to pursue a career in IT and is currently working on obtaining an internship through school.  Patient endorses some anxiety but states that it is manageable at this time.  Patient expresses fears of the future stating that he is afraid that everything that he is working towards will amount  to nothing.  Patient continues to take his medication as prescribed and denies experiencing any depressive symptoms at this time.  Patient endorses stability on his current medication regimen and would like to continue taking his medication as prescribed.  Patient's medication to be e-prescribed to pharmacy of choice.  Collaboration of Care: Collaboration of Care: Medication Management AEB provider managing patient's psychiatric medications, Psychiatrist AEB patient being followed by mental health provider at this facility, and Referral or follow-up with counselor/therapist AEB patient being seen by a licensed clinical social worker at this facility  Patient/Guardian was advised Release of Information must be obtained prior to any record release in order to collaborate their care with an outside provider. Patient/Guardian was advised if they have not already done so to contact the registration  department to sign all necessary forms in order for [place] to release information regarding their care.   Consent: Patient/Guardian gives verbal consent for treatment and assignment of benefits for services provided during this visit. Patient/Guardian expressed understanding and agreed to proceed.   1. Generalized anxiety disorder  - escitalopram (LEXAPRO) 10 MG tablet; Take 1 tablet (10 mg total) by mouth daily.  Dispense: 30 tablet; Refill: 2  2. Social anxiety disorder  - escitalopram (LEXAPRO) 10 MG tablet; Take 1 tablet (10 mg total) by mouth daily.  Dispense: 30 tablet; Refill: 2  Patient to follow-up in 2 months Provider spent a total of 40 minutes with the patient/reviewing patient's chart  Meta Hatchet, PA 05/17/2023, 7:11 PM

## 2023-06-06 ENCOUNTER — Ambulatory Visit (INDEPENDENT_AMBULATORY_CARE_PROVIDER_SITE_OTHER): Payer: Medicaid Other | Admitting: Licensed Clinical Social Worker

## 2023-06-06 DIAGNOSIS — F4010 Social phobia, unspecified: Secondary | ICD-10-CM

## 2023-06-06 DIAGNOSIS — F411 Generalized anxiety disorder: Secondary | ICD-10-CM | POA: Diagnosis not present

## 2023-06-06 NOTE — Progress Notes (Signed)
THERAPIST PROGRESS NOTE  Virtual Visit via Video Note  I connected with [name] on 06/06/23 at 11:00 AM EDT by a video enabled telemedicine application and verified that I am speaking with the correct person using two identifiers.  Location: Patient: [facility]  Provider: Providers Home    I discussed the limitations of evaluation and management by telemedicine and the availability of in person appointments. The patient expressed understanding and agreed to proceed.    I discussed the assessment and treatment plan with the patient. The patient was provided an opportunity to ask questions and all were answered. The patient agreed with the plan and demonstrated an understanding of the instructions.   The patient was advised to call back or seek an in-person evaluation if the symptoms worsen or if the condition fails to improve as anticipated.  I provided 45 minutes of non-face-to-face time during this encounter.   [name], LCSW   Participation Level: Active  Behavioral Response: CasualAlertAnxious  Type of Therapy: Individual Therapy  Treatment Goals addressed:  Active     Anxiety Disorder CCP Problem  1 Agoraphobia       Identify 3 triggers for anxiety  (Progressing)     Start:  08/29/22    Expected End:  07/29/23       Goal Note     School  Future goals          workout 3 x weekly  (Progressing)     Start:  08/29/22    Expected End:  07/29/23       Goal Note     Self reports working 3 to 4 x weekly          no purging 7/7 days weekly  (Progressing)     Start:  08/29/22    Expected End:  07/29/23       Goal Note     No purging reported today          maintain 20 hour weekly of employment  (Progressing)     Start:  08/29/22    Expected End:  07/29/23       Goal Note     "[employer]" pt should be starting in next 30 days          create 3 coping skills for anxiety  (Progressing)     Start:  08/29/22     Expected End:  07/29/23       Goal Note     Working out          LTG: Patient will score less than 5 on the Generalized Anxiety Disorder 7 Scale (GAD-7) (Progressing)     Start:  08/29/22    Expected End:  07/29/23       Goal Note     Down from a 12 to 7          STG: Patient will participate in at least 80% of scheduled individual psychotherapy sessions (Progressing)     Start:  08/29/22    Expected End:  07/29/23         STG: Patient will complete at least 80% of assigned homework (Progressing)     Start:  08/29/22    Expected End:  07/29/23         Review results of GAD-7 with the patient to track progress     Start:  08/29/22         Discuss risks and benefits of medication treatment  options for this problem and prescribe as indicated (Completed)     Start:  08/29/22    End:  10/02/22      Encourage patient to take psychotropic medication as prescribed (Completed)     Start:  08/29/22    End:  10/02/22      Work with patient to track symptoms, triggers and/or skill use through a mood chart, diary card, or journal (Completed)     Start:  08/29/22    End:  11/06/22      Perform psychoeducation regarding anxiety disorders (Completed)     Start:  08/29/22    End:  10/02/22      Provide patient with educational information and reading material on anxiety, its causes, and symptoms (Completed)     Start:  08/29/22    End:  11/06/22      Work with patient to identify the major components of a recent episode of anxiety: physical symptoms, major thoughts and images, and major behaviors they experienced (Completed)     Start:  08/29/22    End:  11/06/22         ProgressTowards Goals: Progressing  Interventions: CBT, Motivational Interviewing, and Supportive   Suicidal/Homicidal: Nowithout intent/plan  Therapist Response:     Pt was alert and oriented x 5. He was dressed casually and engaged well in therapy session. He was pleasant, cooperative,  and maintained good eye contact. Dwayne presented with anxious mood/affect.   Primary stressor is family conflict. He reports that he has a lot of resentment for his mother due to the way she raised him. But also, towards his father for abandoning him. Pt reports that at 47 yro his mother tried to get him and his father to talk without asking first. Pt reports he did not want to talk to his father. He was angry with his mother for not even asking what he thought about meeting him. Pt reports today "I do not even care if the man lives or dies. Pt reports he does not want to hurt his father but has no feelings for him one way or the other.   Interventions/Plan: LCSW educated pt on forgiveness not for the benefits of others but for [place]. LCSW spoke with pt about growing from bad as well as good things in his life. Milik states today he learned nothing from his father. LCSW reframed that by stating he learned "what not to be as a father."     Plan: Return again in 3 weeks.  Diagnosis: GAD (generalized anxiety disorder)  Collaboration of Care: Other None today   Patient/Guardian was advised Release of Information must be obtained prior to any record release in order to collaborate their care with an outside provider. Patient/Guardian was advised if they have not already done so to contact the registration department to sign all necessary forms in order for [place] to release information regarding their care.   Consent: Patient/Guardian gives verbal consent for treatment and assignment of benefits for services provided during this visit. Patient/Guardian expressed understanding and agreed to proceed.   [name], LCSW 06/06/2023

## 2023-06-27 ENCOUNTER — Ambulatory Visit (INDEPENDENT_AMBULATORY_CARE_PROVIDER_SITE_OTHER): Payer: Medicaid Other | Admitting: Licensed Clinical Social Worker

## 2023-06-27 DIAGNOSIS — F4000 Agoraphobia, unspecified: Secondary | ICD-10-CM | POA: Diagnosis not present

## 2023-06-27 DIAGNOSIS — F411 Generalized anxiety disorder: Secondary | ICD-10-CM | POA: Diagnosis not present

## 2023-06-27 DIAGNOSIS — F6381 Intermittent explosive disorder: Secondary | ICD-10-CM

## 2023-06-27 NOTE — Progress Notes (Addendum)
THERAPIST PROGRESS NOTE  Virtual Visit via Video Note  I connected with Cody Weaver on 07/03/23 at 10:00 AM EDT by a video enabled telemedicine application and verified that I am speaking with the correct person using two identifiers.  Location: Patient: [facility]  Provider: Providers Home    I discussed the limitations of evaluation and management by telemedicine and the availability of in person appointments. The patient expressed understanding and agreed to proceed.     I discussed the assessment and treatment plan with the patient. The patient was provided an opportunity to ask questions and all were answered. The patient agreed with the plan and demonstrated an understanding of the instructions.   The patient was advised to call back or seek an in-person evaluation if the symptoms worsen or if the condition fails to improve as anticipated.  I provided 50 minutes of non-face-to-face time during this encounter.   [name], LCSW   Participation Level: Active  Behavioral Response: CasualAlertAnxious and Depressed  Type of Therapy: Individual Therapy  Treatment Goals addressed:  Active     Anxiety Disorder CCP Problem  1 Agoraphobia       Identify 3 triggers for anxiety  (Progressing)     Start:  08/29/22    Expected End:  07/29/23       Goal Note     Financial  School          workout 3 x weekly  (Progressing)     Start:  08/29/22    Expected End:  07/29/23       Goal Note     Self reports working 3 to 4 x weekly          no purging 7/7 days weekly  (Progressing)     Start:  08/29/22    Expected End:  07/29/23         maintain 20 hour weekly of employment  (Progressing)     Start:  08/29/22    Expected End:  07/29/23       Goal Note     Obtained employment at [employer]          create 3 coping skills for anxiety  (Progressing)     Start:  08/29/22    Expected End:  07/29/23       Goal Note     Working out           LTG: Patient will score less than 5 on the Generalized Anxiety Disorder 7 Scale (GAD-7) (Progressing)     Start:  08/29/22    Expected End:  07/29/23       Goal Note     Down from a 12 to 7          STG: Patient will participate in at least 80% of scheduled individual psychotherapy sessions (Progressing)     Start:  08/29/22    Expected End:  07/29/23         STG: Patient will complete at least 80% of assigned homework (Progressing)     Start:  08/29/22    Expected End:  07/29/23         Review results of GAD-7 with the patient to track progress     Start:  08/29/22         Discuss risks and benefits of medication treatment options for this problem and prescribe as indicated (Completed)     Start:  08/29/22    End:  10/02/22  Encourage patient to take psychotropic medication as prescribed (Completed)     Start:  08/29/22    End:  10/02/22      Work with patient to track symptoms, triggers and/or skill use through a mood chart, diary card, or journal (Completed)     Start:  08/29/22    End:  11/06/22      Perform psychoeducation regarding anxiety disorders (Completed)     Start:  08/29/22    End:  10/02/22      Provide patient with educational information and reading material on anxiety, its causes, and symptoms (Completed)     Start:  08/29/22    End:  11/06/22      Work with patient to identify the major components of a recent episode of anxiety: physical symptoms, major thoughts and images, and major behaviors they experienced (Completed)     Start:  08/29/22    End:  11/06/22        ProgressTowards Goals: Progressing  Interventions: CBT, Motivational Interviewing, and Supportive  Suicidal/Homicidal: Nowithout intent/plan  Therapist Response:     Pt was alert and oriented x 5. Cody Weaver was dressed casually and engaged well in therapy session. He presented with anxious mood/affect. Pt was pleasant, cooperative and maintained good eye  contact. He was alert and oriented x 5.  Pt reports primary stressors as work, school, and Education officer, community. He reports she has found a job at [employer]. He does not enjoy working there but knows this gives him extra income until he can get a degree or certification. 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 Pt reports that he continues to go to his classes while working part time which has been stressful. He endorses symptoms for tension, worry, and irritability.  Interventions/Plan: LCSW educated pt on realistic expectations. LCSW used praise and encouragement utilizing supportive therapy. LCSW used psychoanalytic therapy for pt to express thoughts, feelings and concerns in session. LCSW used reframing for CBT.    Plan: Return again in 3 weeks.  Diagnosis: GAD (generalized anxiety disorder)  Intermittent explosive disorder in adult  Agoraphobia  Collaboration of Care: Other None today   Patient/Guardian was advised Release of Information must be obtained prior to any record release in order to collaborate their care with an outside provider. Patient/Guardian was advised if they have not already done so to contact the registration department to sign all necessary forms in order for [place] to release information regarding their care.   Consent: Patient/Guardian gives verbal consent for treatment and assignment of benefits for services provided during this visit. Patient/Guardian expressed understanding and agreed to proceed.   [name], LCSW 07/03/2023

## 2023-07-11 ENCOUNTER — Ambulatory Visit (INDEPENDENT_AMBULATORY_CARE_PROVIDER_SITE_OTHER): Payer: Medicaid Other | Admitting: Licensed Clinical Social Worker

## 2023-07-11 DIAGNOSIS — F6381 Intermittent explosive disorder: Secondary | ICD-10-CM | POA: Diagnosis not present

## 2023-07-11 DIAGNOSIS — F4000 Agoraphobia, unspecified: Secondary | ICD-10-CM | POA: Diagnosis not present

## 2023-07-11 DIAGNOSIS — F411 Generalized anxiety disorder: Secondary | ICD-10-CM | POA: Diagnosis not present

## 2023-07-11 NOTE — Progress Notes (Signed)
THERAPIST PROGRESS NOTE  Virtual Visit via Video Note  I connected with [name] on 07/11/23 at  3:00 PM EDT by a video enabled telemedicine application and verified that I am speaking with the correct person using two identifiers.  Location: Patient: [facility]  Provider: Providers Home    I discussed the limitations of evaluation and management by telemedicine and the availability of in person appointments. The patient expressed understanding and agreed to proceed.    I discussed the assessment and treatment plan with the patient. The patient was provided an opportunity to ask questions and all were answered. The patient agreed with the plan and demonstrated an understanding of the instructions.   The patient was advised to call back or seek an in-person evaluation if the symptoms worsen or if the condition fails to improve as anticipated.  I provided 45 minutes of non-face-to-face time during this encounter.   [name], LCSW   Participation Level: Active  Behavioral Response: CasualAlertAnxious and Depressed  Type of Therapy: Individual Therapy  Treatment Goals addressed:  Active     Anxiety Disorder CCP Problem  1 Agoraphobia       Identify 3 triggers for anxiety  (Progressing)     Start:  08/29/22    Expected End:  07/29/23       Goal Note     Work           workout 3 x weekly  (Progressing)     Start:  08/29/22    Expected End:  07/29/23         no purging 7/7 days weekly  (Progressing)     Start:  08/29/22    Expected End:  07/29/23         maintain 20 hour weekly of employment  (Progressing)     Start:  08/29/22    Expected End:  07/29/23       Goal Note     Working at [employer]          create 3 coping skills for anxiety  (Progressing)     Start:  08/29/22    Expected End:  07/29/23       Goal Note     Working out          LTG: Patient will score less than 5 on the Generalized Anxiety Disorder 7 Scale  (GAD-7) (Progressing)     Start:  08/29/22    Expected End:  07/29/23       Goal Note     Down from a 12 to 7          STG: Patient will participate in at least 80% of scheduled individual psychotherapy sessions (Progressing)     Start:  08/29/22    Expected End:  07/29/23         STG: Patient will complete at least 80% of assigned homework (Progressing)     Start:  08/29/22    Expected End:  07/29/23         Review results of GAD-7 with the patient to track progress     Start:  08/29/22         Discuss risks and benefits of medication treatment options for this problem and prescribe as indicated (Completed)     Start:  08/29/22    End:  10/02/22      Encourage patient to take psychotropic medication as prescribed (Completed)     Start:  08/29/22  End:  10/02/22      Work with patient to track symptoms, triggers and/or skill use through a mood chart, diary card, or journal (Completed)     Start:  08/29/22    End:  11/06/22      Perform psychoeducation regarding anxiety disorders (Completed)     Start:  08/29/22    End:  10/02/22      Provide patient with educational information and reading material on anxiety, its causes, and symptoms (Completed)     Start:  08/29/22    End:  11/06/22      Work with patient to identify the major components of a recent episode of anxiety: physical symptoms, major thoughts and images, and major behaviors they experienced (Completed)     Start:  08/29/22    End:  11/06/22         ProgressTowards Goals: Progressing  Interventions: CBT, Motivational Interviewing, and Supportive   Suicidal/Homicidal: Nowithout intent/plan  Therapist Response:   Pt was alert and oriented x 5. He was dressed casually and engaged well in therapy session. Onis was pleasant, cooperative and maintained good eye contact. He presented with irritable and anxious mood/affect.   Primary stressor is work. Pt reports he works at [employer] and got  called into [employer] stating he was not doing an adequate job. Pt reports that if he does not improve, they could fire him. He states that the International aid/development worker told him that there was a 90-month learning curve to this job but when she was in the office with the other manager that called Krystal Clark into Therapist, music did not defend him. Pt endorses symptoms for irritability, anger, tension and worry. He reports frustration because people that are mean get away with things but if he is mean he is the only one that gets consequences. He reports when he is nice it does not seem to get him anywhere.  Intervention/Plan: LCSW educated pt o cognitive distortion for "catastrophic language". LCSW educated pt on signs and symptoms for anxiety such as irritability and tension. LCSW educated pt on coping skills for working out and deep breathing.     Plan: Return again in 3 weeks.  Diagnosis: GAD (generalized anxiety disorder)  Agoraphobia  Intermittent explosive disorder in adult  Collaboration of Care: Other None today   Patient/Guardian was advised Release of Information must be obtained prior to any record release in order to collaborate their care with an outside provider. Patient/Guardian was advised if they have not already done so to contact the registration department to sign all necessary forms in order for [place] to release information regarding their care.   Consent: Patient/Guardian gives verbal consent for treatment and assignment of benefits for services provided during this visit. Patient/Guardian expressed understanding and agreed to proceed.   [name], LCSW 07/11/2023

## 2023-07-19 ENCOUNTER — Encounter (HOSPITAL_COMMUNITY): Payer: Medicaid Other | Admitting: Physician Assistant

## 2023-07-25 ENCOUNTER — Telehealth (INDEPENDENT_AMBULATORY_CARE_PROVIDER_SITE_OTHER): Payer: Medicaid Other | Admitting: Physician Assistant

## 2023-07-25 DIAGNOSIS — F4010 Social phobia, unspecified: Secondary | ICD-10-CM

## 2023-07-25 DIAGNOSIS — F411 Generalized anxiety disorder: Secondary | ICD-10-CM

## 2023-07-26 ENCOUNTER — Encounter (HOSPITAL_COMMUNITY): Payer: Self-pay | Admitting: Physician Assistant

## 2023-07-26 MED ORDER — ESCITALOPRAM OXALATE 10 MG PO TABS: 10.0000 mg | ORAL_TABLET | Freq: Every day | ORAL | 2 refills | Status: AC

## 2023-07-26 NOTE — Progress Notes (Unsigned)
BH MD/PA/NP OP Progress Note  Virtual Visit via Video Note  I connected with Cody Weaver on 07/26/23 at 11:30 AM EDT by a video enabled telemedicine application and verified that I am speaking with the correct person using two identifiers.  Location: Patient: Home Provider: Clinic   I discussed the limitations of evaluation and management by telemedicine and the availability of in person appointments. The patient expressed understanding and agreed to proceed.  Follow Up Instructions:   I discussed the assessment and treatment plan with the patient. The patient was provided an opportunity to ask questions and all were answered. The patient agreed with the plan and demonstrated an understanding of the instructions.   The patient was advised to call back or seek an in-person evaluation if the symptoms worsen or if the condition fails to improve as anticipated.  I provided 30 minutes of non-face-to-face time during this encounter.  Meta Hatchet, PA    07/26/2023 5:55 PM Cody Weaver  MRN:  [id number]  Chief Complaint:  Chief Complaint  Patient presents with   Follow-up   Medication Refill   HPI:   Cody Weaver is a 27 year old, African-American male with a past psychiatric history significant for generalized anxiety disorder and social anxiety disorder who presents to [facility] for follow and medication management.  Patient is currently being managed on the following medication: Escitalopram 10 mg daily.  Patient reports that his use of Lexapro is going well.  He reports that since taking the medication, he is not worried about stuff going on around him.  He reports that he used to feel too alert when on medication in the past, but states that his Lexapro has made him feel more normal.  Patient denies depression stating that he has become more passionate about his journey and is okay with most things in his life.  Patient  reports that he is currently trying to find outside work.  The only stressor that the patient endorses currently is his weight and his eating habits.  A GAD-7 screen was performed with the patient scoring a 2.  Patient is alert and oriented x 4, calm, cooperative, and fully engaged in conversation during the encounter.  Patient endorses good mood.  Patient denies suicidal or homicidal ideations.  She further denies auditory or visual hallucinations and does not appear to be responding to internal/external stimuli.  Patient endorses good sleep and receives on average 8 hours of sleep per night.  Patient endorses fair appetite and eats on average 2 meals per day.  Patient denies alcohol consumption, tobacco use, or illicit drug use.  Visit Diagnosis:    ICD-10-CM   1. Generalized anxiety disorder  F41.1 escitalopram (LEXAPRO) 10 MG tablet    2. Social anxiety disorder  F40.10 escitalopram (LEXAPRO) 10 MG tablet      Past Psychiatric History:  No diagnoses prior to 07/20/2022   Current diagnoses: Generalized anxiety disorder Social anxiety disorder Agoraphobia  Past Medical History:  Past Medical History:  Diagnosis Date   Obesity    History reviewed. No pertinent surgical history.  Family Psychiatric History:  Patient denies family history of psychiatric illness   Family History: History reviewed. No pertinent family history.  Social History:  Social History   Socioeconomic History   Marital status: Single    Spouse name: Not on file   Number of children: Not on file   Years of education: Not on file   Highest education  level: Not on file  Occupational History   Not on file  Tobacco Use   Smoking status: Never   Smokeless tobacco: Never  Substance and Sexual Activity   Alcohol use: Never   Drug use: Never   Sexual activity: Not on file  Other Topics Concern   Not on file  Social History Narrative   Not on file   Social Determinants of Health   Financial Resource  Strain: High Risk (08/29/2022)   Overall Financial Resource Strain (CARDIA)    Difficulty of Paying Living Expenses: Hard  Food Insecurity: Not on file (07/08/2023)  Transportation Needs: No Transportation Needs (08/29/2022)   PRAPARE - Administrator, Civil Service (Medical): No    Lack of Transportation (Non-Medical): No  Physical Activity: Insufficiently Active (08/29/2022)   Exercise Vital Sign    Days of Exercise per Week: 3 days    Minutes of Exercise per Session: 30 min  Stress: Stress Concern Present (08/29/2022)   [employer] of Occupational Health - Occupational Stress Questionnaire    Feeling of Stress : Very much  Social Connections: Not on File (07/11/2023)   Received from [facility]   Social Connections    Connectedness: 0    Allergies: No Known Allergies  Metabolic Disorder Labs: No results found for: "HGBA1C", "MPG" No results found for: "PROLACTIN" No results found for: "CHOL", "TRIG", "HDL", "CHOLHDL", "VLDL", "LDLCALC" No results found for: "TSH"  Therapeutic Level Labs: No results found for: "LITHIUM" No results found for: "VALPROATE" No results found for: "CBMZ"  Current Medications: Current Outpatient Medications  Medication Sig Dispense Refill   escitalopram (LEXAPRO) 10 MG tablet Take 1 tablet (10 mg total) by mouth daily. 30 tablet 2   hydrOXYzine (ATARAX) 10 MG tablet Take 1 tablet (10 mg total) by mouth 3 (three) times daily as needed. 75 tablet 1   omeprazole (PRILOSEC) 20 MG capsule Take 1 capsule (20 mg total) by mouth daily. 30 capsule 1   Vitamin D, Ergocalciferol, (DRISDOL) 1.25 MG (50000 UNIT) CAPS capsule Take 50,000 Units by mouth once a week.     No current facility-administered medications for this visit.     Musculoskeletal: Strength & Muscle Tone: within normal limits Gait & Station: normal Patient leans: N/A  Psychiatric Specialty Exam: Review of Systems  Psychiatric/Behavioral:  Negative for decreased concentration,  dysphoric mood, hallucinations, self-injury, sleep disturbance and suicidal ideas. The patient is not nervous/anxious and is not hyperactive.     There were no vitals taken for this visit.There is no height or weight on file to calculate BMI.  General Appearance: Casual  Eye Contact:  Good  Speech:  Clear and Coherent and Normal Rate  Volume:  Normal  Mood:  Euthymic  Affect:  Appropriate  Thought Process:  Coherent and Descriptions of Associations: Intact  Orientation:  Full (Time, Place, and Person)  Thought Content: WDL   Suicidal Thoughts:  No  Homicidal Thoughts:  No  Memory:  Immediate;   Good Recent;   Good Remote;   Fair  Judgement:  Good  Insight:  Fair  Psychomotor Activity:  Normal  Concentration:  Concentration: Good and Attention Span: Good  Recall:  Good  Fund of Knowledge: Good  Language: Good  Akathisia:  NA  Handed:  Right  AIMS (if indicated): not done  Assets:  Communication Skills Desire for Improvement Housing Resilience Social Support Vocational/Educational  ADL's:  Intact  Cognition: WNL  Sleep:  Good   Screenings: GAD-7  Flowsheet Row Video Visit from 07/25/2023 in [facility] Video Visit from 05/17/2023 in [facility] Video Visit from 03/29/2023 in [facility] Counselor from 02/21/2023 in [facility] Clinical Support from 02/13/2023 in [facility]  Total GAD-7 Score 2 12 14 7 12       PHQ2-9    Flowsheet Row Video Visit from 07/25/2023 in [facility] Video Visit from 05/17/2023 in [facility] Video Visit from 03/29/2023 in [facility] Clinical Support from 02/13/2023 in [facility] Clinical Support from 12/14/2022 in [facility]  PHQ-2 Total Score 0 2 3 0 2  PHQ-9  Total Score -- 3 13 -- 6      Flowsheet Row Video Visit from 07/25/2023 in [facility] Video Visit from 05/17/2023 in [facility] Video Visit from 03/29/2023 in [facility]  C-SSRS RISK CATEGORY No Risk No Risk No Risk        Assessment and Plan:   Cody Weaver is a 27 year old, African-American male with a past psychiatric history significant for generalized anxiety disorder and social anxiety disorder who presents to [facility] via virtual video visit for follow and medication management.  Patient denies depressive symptoms at this time and states that his use of his Lexapro has been helpful in managing his symptoms.  Patient endorses stability on his current medication regimen and would like to continue taking his Lexapro as prescribed.  Patient's medication to be e-prescribed through pharmacy of choice.  Collaboration of Care: Collaboration of Care: Medication Management AEB provider managing patient's psychiatric medications, Psychiatrist AEB patient being followed by mental health provider at this facility, and Referral or follow-up with counselor/therapist AEB patient being seen by a licensed clinical social worker at this facility  Patient/Guardian was advised Release of Information must be obtained prior to any record release in order to collaborate their care with an outside provider. Patient/Guardian was advised if they have not already done so to contact the registration department to sign all necessary forms in order for [place] to release information regarding their care.   Consent: Patient/Guardian gives verbal consent for treatment and assignment of benefits for services provided during this visit. Patient/Guardian expressed understanding and agreed to proceed.   1. Generalized anxiety disorder  - escitalopram (LEXAPRO) 10 MG tablet; Take 1 tablet (10  mg total) by mouth daily.  Dispense: 30 tablet; Refill: 2  2. Social anxiety disorder  - escitalopram (LEXAPRO) 10 MG tablet; Take 1 tablet (10 mg total) by mouth daily.  Dispense: 30 tablet; Refill: 2  Patient to follow-up in 2 months Provider spent a total of 30 minutes with the patient/reviewing patient's chart  Meta Hatchet, PA 07/26/2023, 5:55 PM

## 2023-08-01 ENCOUNTER — Ambulatory Visit (HOSPITAL_COMMUNITY): Payer: Medicaid Other | Admitting: Licensed Clinical Social Worker

## 2023-08-01 DIAGNOSIS — F411 Generalized anxiety disorder: Secondary | ICD-10-CM | POA: Diagnosis not present

## 2023-08-01 DIAGNOSIS — F4000 Agoraphobia, unspecified: Secondary | ICD-10-CM

## 2023-08-01 NOTE — Progress Notes (Signed)
THERAPIST PROGRESS NOTE Virtual Visit via Video Note  I connected with [name] on 08/01/23 at  3:00 PM EDT by a video enabled telemedicine application and verified that I am speaking with the correct person using two identifiers.  Location: Patient: [facility]  Provider: Providers Home    I discussed the limitations of evaluation and management by telemedicine and the availability of in person appointments. The patient expressed understanding and agreed to proceed.   I discussed the assessment and treatment plan with the patient. The patient was provided an opportunity to ask questions and all were answered. The patient agreed with the plan and demonstrated an understanding of the instructions.   The patient was advised to call back or seek an in-person evaluation if the symptoms worsen or if the condition fails to improve as anticipated.  I provided 45 minutes of non-face-to-face time during this encounter.   [name], LCSW   Participation Level: Active  Behavioral Response: CasualAlertDepressed  Type of Therapy: Individual Therapy  Treatment Goals addressed:  Active     Anxiety Disorder CCP Problem  1 Agoraphobia       Identify 3 triggers for anxiety  (Progressing)     Start:  08/29/22    Expected End:  12/27/23         workout 3 x weekly  (Progressing)     Start:  08/29/22    Expected End:  12/27/23         no purging 7/7 days weekly  (Progressing)     Start:  08/29/22    Expected End:  12/27/23         maintain 20 hour weekly of employment  (Not Progressing)     Start:  08/29/22    Expected End:  12/27/23         create 3 coping skills for anxiety  (Progressing)     Start:  08/29/22    Expected End:  12/27/23       Goal Note     Working out          LTG: Patient will score less than 5 on the Generalized Anxiety Disorder 7 Scale (GAD-7) (Progressing)     Start:  08/29/22    Expected End:  12/27/23       Goal Note      Down from a 12 to 7          STG: Patient will participate in at least 80% of scheduled individual psychotherapy sessions (Progressing)     Start:  08/29/22    Expected End:  12/27/23         STG: Patient will complete at least 80% of assigned homework (Progressing)     Start:  08/29/22    Expected End:  12/27/23         Review results of GAD-7 with the patient to track progress     Start:  08/29/22         Discuss risks and benefits of medication treatment options for this problem and prescribe as indicated (Completed)     Start:  08/29/22    End:  10/02/22      Encourage patient to take psychotropic medication as prescribed (Completed)     Start:  08/29/22    End:  10/02/22      Work with patient to track symptoms, triggers and/or skill use through a mood chart, diary card, or journal (Completed)     Start:  08/29/22  End:  11/06/22      Perform psychoeducation regarding anxiety disorders (Completed)     Start:  08/29/22    End:  10/02/22      Provide patient with educational information and reading material on anxiety, its causes, and symptoms (Completed)     Start:  08/29/22    End:  11/06/22      Work with patient to identify the major components of a recent episode of anxiety: physical symptoms, major thoughts and images, and major behaviors they experienced (Completed)     Start:  08/29/22    End:  11/06/22         ProgressTowards Goals: Progressing  Interventions: Motivational Interviewing and Supportive  Suicidal/Homicidal: Nowithout intent/plan  Therapist Response:     Pt was alert and oriented x 5. He was dressed casually and engaged well in therapy session. He presented with depressed and anxious mood/affect. Wong was pleasant, cooperative and maintained good eye contact.  Pt reports primary stressor as school. He states that he is excited to start class but worried about his class workload. He endorses symptoms for tension and worry. 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 Pt reports he continue to lose weight but eating cleaner with his food choices.  Interventions/Plan: LCSW used supportive therapy for praise and encouragement. LCSW and pt talked about communication skills for tone of voice. LCSW and pt spoke with pt about advocating for needs and wants without coming off aggressively. LCSW and pt spoke today about conflict resolution  Plan: Return again in 4 weeks.  Diagnosis: GAD (generalized anxiety disorder)  Agoraphobia  Collaboration of Care: Other None today   Patient/Guardian was advised Release of Information must be obtained prior to any record release in order to collaborate their care with an outside provider. Patient/Guardian was advised if they have not already done so to contact the registration department to sign all necessary forms in order for [place] to release information regarding their care.   Consent: Patient/Guardian gives verbal consent for treatment and assignment of benefits for services provided during this visit. Patient/Guardian expressed understanding and agreed to proceed.   [name], LCSW 08/01/2023

## 2023-08-26 ENCOUNTER — Ambulatory Visit (INDEPENDENT_AMBULATORY_CARE_PROVIDER_SITE_OTHER): Payer: Medicaid Other | Admitting: Licensed Clinical Social Worker

## 2023-08-26 DIAGNOSIS — F6381 Intermittent explosive disorder: Secondary | ICD-10-CM

## 2023-08-26 DIAGNOSIS — F411 Generalized anxiety disorder: Secondary | ICD-10-CM
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
## 2023-09-16 ENCOUNTER — Ambulatory Visit (INDEPENDENT_AMBULATORY_CARE_PROVIDER_SITE_OTHER): Payer: Medicaid Other | Admitting: Licensed Clinical Social Worker

## 2023-09-16 DIAGNOSIS — F6381 Intermittent explosive disorder: Secondary | ICD-10-CM | POA: Diagnosis not present

## 2023-09-16 DIAGNOSIS — F411 Generalized anxiety disorder: Secondary | ICD-10-CM | POA: Diagnosis not present

## 2023-09-16 DIAGNOSIS — F4000 Agoraphobia, unspecified: Secondary | ICD-10-CM | POA: Diagnosis not present

## 2023-09-16 NOTE — Progress Notes (Signed)
THERAPIST PROGRESS NOTE  Virtual Visit via Video Note  I connected with [name] on 09/16/23 at  3:00 PM EST by a video enabled telemedicine application and verified that I am speaking with the correct person using two identifiers.  Location: Patient: [facility]  Provider: Provider home    I discussed the limitations of evaluation and management by telemedicine and the availability of in person appointments. The patient expressed understanding and agreed to proceed.     I discussed the assessment and treatment plan with the patient. The patient was provided an opportunity to ask questions and all were answered. The patient agreed with the plan and demonstrated an understanding of the instructions.   The patient was advised to call back or seek an in-person evaluation if the symptoms worsen or if the condition fails to improve as anticipated.  I provided 50 minutes of non-face-to-face time during this encounter.   [name], LCSW   Participation Level: Active  Behavioral Response: CasualAlertAnxious and Depressed  Type of Therapy: Individual Therapy  Treatment Goals addressed:  Active     Anxiety Disorder CCP Problem  1 Agoraphobia       Identify 3 triggers for anxiety  (Progressing)     Start:  08/29/22    Expected End:  12/27/23         workout 3 x weekly  (Progressing)     Start:  08/29/22    Expected End:  12/27/23         no purging 7/7 days weekly  (Progressing)     Start:  08/29/22    Expected End:  12/27/23         maintain 20 hour weekly of employment  (Not Progressing)     Start:  08/29/22    Expected End:  12/27/23         create 3 coping skills for anxiety  (Progressing)     Start:  08/29/22    Expected End:  12/27/23         LTG: Patient will score less than 5 on the Generalized Anxiety Disorder 7 Scale (GAD-7) (Progressing)     Start:  08/29/22    Expected End:  12/27/23       Goal Note     Down from a 12 to 7           STG: Patient will participate in at least 80% of scheduled individual psychotherapy sessions (Progressing)     Start:  08/29/22    Expected End:  12/27/23         STG: Patient will complete at least 80% of assigned homework (Progressing)     Start:  08/29/22    Expected End:  12/27/23         Review results of GAD-7 with the patient to track progress     Start:  08/29/22         Discuss risks and benefits of medication treatment options for this problem and prescribe as indicated (Completed)     Start:  08/29/22    End:  10/02/22      Encourage patient to take psychotropic medication as prescribed (Completed)     Start:  08/29/22    End:  10/02/22      Work with patient to track symptoms, triggers and/or skill use through a mood chart, diary card, or journal (Completed)     Start:  08/29/22    End:  11/06/22  Perform psychoeducation regarding anxiety disorders (Completed)     Start:  08/29/22    End:  10/02/22      Provide patient with educational information and reading material on anxiety, its causes, and symptoms (Completed)     Start:  08/29/22    End:  11/06/22      Work with patient to identify the major components of a recent episode of anxiety: physical symptoms, major thoughts and images, and major behaviors they experienced (Completed)     Start:  08/29/22    End:  11/06/22       ProgressTowards Goals: Progressing  Interventions: CBT, Motivational Interviewing, and Supportive   Suicidal/Homicidal: Nowithout intent/plan  Therapist Response:   Patient was alert and oriented x 5.  He was pleasant, cooperative, maintained good eye contact.  He engaged well in therapy session was dressed casually.  He presented today with anxious mood\affect.  Patient reports primary stressors as family conflict and friendships.  Patient reports that he struggles with communication.  He reports that he internalizes things on a regular basis which makes him  resentful towards other people.  Patient reports that some of his family members do not have a similar mindset as he does as far as "growth is concerned".  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  Intervention/plan: LCSW utilized psycho analytic therapy for patient to express thoughts, feelings and concerns.  LCSW spoke with patient about triggers for anxiety such as family conflict and communication.  LCSW used supportive therapy for praise and encouragement.    Plan: Return again in 3 weeks.  Diagnosis: Intermittent explosive disorder in adult  GAD (generalized anxiety disorder)  Agoraphobia  Collaboration of Care: Other None today   Patient/Guardian was advised Release of Information must be obtained prior to any record release in order to collaborate their care with an outside provider. Patient/Guardian was advised if they have not already done so to contact the registration department to sign all necessary forms in order for [place] to release information regarding their care.   Consent: Patient/Guardian gives verbal consent for treatment and assignment of benefits for services provided during this visit. Patient/Guardian expressed understanding and agreed to proceed.   [name], LCSW 09/16/2023

## 2023-10-10 ENCOUNTER — Ambulatory Visit (HOSPITAL_COMMUNITY): Payer: Medicaid Other | Admitting: Licensed Clinical Social Worker

## 2023-10-10 DIAGNOSIS — F411 Generalized anxiety disorder: Secondary | ICD-10-CM

## 2023-10-10 DIAGNOSIS — F6381 Intermittent explosive disorder: Secondary | ICD-10-CM

## 2023-10-10 NOTE — Progress Notes (Signed)
THERAPIST PROGRESS NOTE  Session Time:   Participation Level: Active  Behavioral Response: CasualAlertAnxious and Depressed  Type of Therapy: Individual Therapy  Treatment Goals addressed:  Active     Anxiety Disorder CCP Problem  1 Agoraphobia       Identify 3 triggers for anxiety  (Progressing)     Start:  08/29/22    Expected End:  12/27/23         workout 3 x weekly  (Progressing)     Start:  08/29/22    Expected End:  12/27/23         no purging 7/7 days weekly  (Progressing)     Start:  08/29/22    Expected End:  12/27/23         maintain 20 hour weekly of employment  (Not Progressing)     Start:  08/29/22    Expected End:  12/27/23         create 3 coping skills for anxiety  (Progressing)     Start:  08/29/22    Expected End:  12/27/23         LTG: Patient will score less than 5 on the Generalized Anxiety Disorder 7 Scale (GAD-7) (Progressing)     Start:  08/29/22    Expected End:  12/27/23       Goal Note     Down from a 12 to 7          STG: Patient will participate in at least 80% of scheduled individual psychotherapy sessions (Progressing)     Start:  08/29/22    Expected End:  12/27/23         STG: Patient will complete at least 80% of assigned homework (Progressing)     Start:  08/29/22    Expected End:  12/27/23         Review results of GAD-7 with the patient to track progress     Start:  08/29/22         Discuss risks and benefits of medication treatment options for this problem and prescribe as indicated (Completed)     Start:  08/29/22    End:  10/02/22      Encourage patient to take psychotropic medication as prescribed (Completed)     Start:  08/29/22    End:  10/02/22      Work with patient to track symptoms, triggers and/or skill use through a mood chart, diary card, or journal (Completed)     Start:  08/29/22    End:  11/06/22      Perform psychoeducation regarding anxiety disorders (Completed)     Start:   08/29/22    End:  10/02/22      Provide patient with educational information and reading material on anxiety, its causes, and symptoms (Completed)     Start:  08/29/22    End:  11/06/22      Work with patient to identify the major components of a recent episode of anxiety: physical symptoms, major thoughts and images, and major behaviors they experienced (Completed)     Start:  08/29/22    End:  11/06/22         ProgressTowards Goals: Progressing  Interventions: CBT, Supportive, and Reframing  Summary: Cody Weaver is a 27 y.o. male who presents with patient was alert and oriented x 5.  He was pleasant, cooperative, maintained good eye contact.  He engaged well in therapy session was dressed casually.  He presented today  with anxious mood\affect.   Patient reports primary stressors as future goals.  Patient reports that he would like to find a career path utilizing his education.  Patient reports that he first needs to get back into school and get his degree.  Patient reports that he has concerns about budgeting.  LCSW and patient spoke about the 57, 30, 10 rule which is a general referral for financial budgeting for 60% on needs, 30% wants, and 10% in savings.  Patient reports wanting to find a career path so that he can save money and create a retirement plan as he does not know many people that save money responsibly.  He reports that he is currently taking a break from school due to the fact that he is living with his grandparents and helping them for 1 semester.  He endorses symptoms for tension and worry.    Suicidal/Homicidal: Nowithout intent/plan  Therapist Response:     Intervention/plan: LCSW supportive therapy for praise and encouragement.  LCSW psycho analytic therapy for patient to express thoughts, feelings and concerns in session.  LCSW utilized strength-based therapy to review future goals such as career plans and budgeting.  LCSW used empowerment to encourage  patient to finish his degree.  Plan: Return again in 4 weeks.  Diagnosis: GAD (generalized anxiety disorder)  Intermittent explosive disorder in adult  Collaboration of Care: Other None today   Patient/Guardian was advised Release of Information must be obtained prior to any record release in order to collaborate their care with an outside provider. Patient/Guardian was advised if they have not already done so to contact the registration department to sign all necessary forms in order for [place] to release information regarding their care.   Consent: Patient/Guardian gives verbal consent for treatment and assignment of benefits for services provided during this visit. Patient/Guardian expressed understanding and agreed to proceed.   [name], LCSW 10/10/2023

## 2023-10-24 ENCOUNTER — Ambulatory Visit (HOSPITAL_COMMUNITY): Payer: Medicaid Other | Admitting: Licensed Clinical Social Worker

## 2023-10-24 DIAGNOSIS — F6381 Intermittent explosive disorder: Secondary | ICD-10-CM
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
## 2023-11-11 ENCOUNTER — Ambulatory Visit (HOSPITAL_COMMUNITY): Payer: Medicaid Other | Admitting: Licensed Clinical Social Worker

## 2023-11-11 DIAGNOSIS — F411 Generalized anxiety disorder: Secondary | ICD-10-CM

## 2023-11-11 DIAGNOSIS — F6381 Intermittent explosive disorder: Secondary | ICD-10-CM | POA: Diagnosis not present

## 2023-11-11 NOTE — Progress Notes (Signed)
 THERAPIST PROGRESS NOTE Virtual Visit via Video Note  I connected with Cody Weaver on 11/11/23 at  1:00 PM EST by a video enabled telemedicine application and verified that I am speaking with the correct person using two identifiers.  Location: Patient: [facility]  Provider: Providers Home    I discussed the limitations of evaluation and management by telemedicine and the availability of in person appointments. The patient expressed understanding and agreed to proceed.   I discussed the assessment and treatment plan with the patient. The patient was provided an opportunity to ask questions and all were answered. The patient agreed with the plan and demonstrated an understanding of the instructions.   The patient was advised to call back or seek an in-person evaluation if the symptoms worsen or if the condition fails to improve as anticipated.  I provided 55 minutes of non-face-to-face time during this encounter.   Cody GORMAN Patee, LCSW   Participation Level: Active  Behavioral Response: CasualAlertAnxious and Depressed  Type of Therapy: Individual Therapy  Treatment Goals addressed:  Active     Anxiety Disorder CCP Problem  1 Agoraphobia       Identify 3 triggers for anxiety  (Progressing)     Start:  08/29/22    Expected End:  12/27/23         workout 3 x weekly  (Progressing)     Start:  08/29/22    Expected End:  12/27/23         no purging 7/7 days weekly  (Progressing)     Start:  08/29/22    Expected End:  12/27/23         maintain 20 hour weekly of employment  (Not Progressing)     Start:  08/29/22    Expected End:  12/27/23         create 3 coping skills for anxiety  (Progressing)     Start:  08/29/22    Expected End:  12/27/23         LTG: Patient will score less than 5 on the Generalized Anxiety Disorder 7 Scale (GAD-7) (Progressing)     Start:  08/29/22    Expected End:  12/27/23       Goal Note     Down from a 12 to 7           STG: Patient will participate in at least 80% of scheduled individual psychotherapy sessions (Progressing)     Start:  08/29/22    Expected End:  12/27/23         STG: Patient will complete at least 80% of assigned homework (Progressing)     Start:  08/29/22    Expected End:  12/27/23         Review results of GAD-7 with the patient to track progress     Start:  08/29/22         Discuss risks and benefits of medication treatment options for this problem and prescribe as indicated (Completed)     Start:  08/29/22    End:  10/02/22      Encourage patient to take psychotropic medication as prescribed (Completed)     Start:  08/29/22    End:  10/02/22      Work with patient to track symptoms, triggers and/or skill use through a mood chart, diary card, or journal (Completed)     Start:  08/29/22    End:  11/06/22      Perform psychoeducation  regarding anxiety disorders (Completed)     Start:  08/29/22    End:  10/02/22      Provide patient with educational information and reading material on anxiety, its causes, and symptoms (Completed)     Start:  08/29/22    End:  11/06/22      Work with patient to identify the major components of a recent episode of anxiety: physical symptoms, major thoughts and images, and major behaviors they experienced (Completed)     Start:  08/29/22    End:  11/06/22          ProgressTowards Goals: Progressing  Interventions: CBT, Motivational Interviewing, and Supportive  Summary: Cody Weaver is a 28 y.o. male who presents with anxious mood\affect.  He was pleasant, cooperative, maintained good eye contact.  He engaged well in therapy session.  Patient was alert and oriented x 5.  Patient comes in today with primary stressor as setting boundaries with family.  He reports that he does not have too many family members that he can look up to.  He reports that he has recently started cutting out people in his life that he feels do  not appreciate him or do not add value into his life even if they are family.  He reports that he attempted to do this nicely. [name] states that he would often get asked questions such as are you sure about this.  Patient reports that this is best for his mental health moving forward.   Other stressors for patient are feeling overwhelmed and negatively handling situations by escalating them or projecting his feelings.  He reports that at times when he gets frustrated he cannot control his anger and sometimes yells or breaks things.  Suicidal/Homicidal: Nowithout intent/plan  Therapist Response:     Interventions/Plan: LCSW utilized cognitive behavioral therapy for cognitive restructuring and reframing.  LCSW utilized psycho analytic therapy for patient to express thoughts, feelings and concerns and nonjudgmental environment.  LCSW supportive therapy for praise and encouragement.  LCSW spoke with patient's about coping skills for anxiety such as exercise, deep breathing, and Socratic questioning.  Plan: Return again in 3 weeks.  Diagnosis: Intermittent explosive disorder in adult  GAD (generalized anxiety disorder)  Collaboration of Care: Other None today   Patient/Guardian was advised Release of Information must be obtained prior to any record release in order to collaborate their care with an outside provider. Patient/Guardian was advised if they have not already done so to contact the registration department to sign all necessary forms in order for us  to release information regarding their care.   Consent: Patient/Guardian gives verbal consent for treatment and assignment of benefits for services provided during this visit. Patient/Guardian expressed understanding and agreed to proceed.   Cody GORMAN Patee, LCSW 11/11/2023

## 2023-12-02 ENCOUNTER — Encounter (HOSPITAL_COMMUNITY): Payer: Self-pay

## 2023-12-02 ENCOUNTER — Ambulatory Visit (HOSPITAL_COMMUNITY): Payer: Medicaid Other | Admitting: Licensed Clinical Social Worker

## 2023-12-23 ENCOUNTER — Ambulatory Visit (HOSPITAL_COMMUNITY): Payer: Medicaid Other | Admitting: Licensed Clinical Social Worker

## 2023-12-23 DIAGNOSIS — F411 Generalized anxiety disorder: Secondary | ICD-10-CM
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
## 2024-01-27 ENCOUNTER — Ambulatory Visit (INDEPENDENT_AMBULATORY_CARE_PROVIDER_SITE_OTHER): Payer: Medicaid Other | Admitting: Licensed Clinical Social Worker

## 2024-01-27 DIAGNOSIS — F411 Generalized anxiety disorder: Secondary | ICD-10-CM | POA: Diagnosis not present

## 2024-01-27 DIAGNOSIS — F4000 Agoraphobia, unspecified: Secondary | ICD-10-CM | POA: Diagnosis not present

## 2024-01-27 DIAGNOSIS — F6381 Intermittent explosive disorder: Secondary | ICD-10-CM | POA: Diagnosis not present

## 2024-01-27 NOTE — Progress Notes (Unsigned)
 THERAPIST PROGRESS NOTE  Virtual Visit via Video Note  I connected with Cody Weaver on 01/27/24 at  4:00 PM EDT by a video enabled telemedicine application and verified that I am speaking with the correct person using two identifiers.  Location: Patient: [facility]  Provider: Providers Home    I discussed the limitations of evaluation and management by telemedicine and the availability of in person appointments. The patient expressed understanding and agreed to proceed.     I discussed the assessment and treatment plan with the patient. The patient was provided an opportunity to ask questions and all were answered. The patient agreed with the plan and demonstrated an understanding of the instructions.   The patient was advised to call back or seek an in-person evaluation if the symptoms worsen or if the condition fails to improve as anticipated.  I provided 30 minutes of non-face-to-face time during this encounter.   Cody [name], Weaver   Participation Level: Active  Behavioral Response: CasualAlertAnxious and Depressed  Type of Therapy: Individual Therapy  Treatment Goals addressed:  Active     Anxiety Disorder CCP Problem  1 Agoraphobia       Identify 3 triggers for anxiety  (Progressing)     Start:  08/29/22    Expected End:  08/28/24         workout 3 x weekly  (Progressing)     Start:  08/29/22    Expected End:  08/28/24         no purging 7/7 days weekly  (Completed/Met)     Start:  08/29/22    Expected End:  08/28/24    Resolved:  01/27/24      maintain 20 hour weekly of employment  (Not Progressing)     Start:  08/29/22    Expected End:  08/28/24         create 3 coping skills for anxiety  (Progressing)     Start:  08/29/22    Expected End:  08/28/24         LTG: Patient will score less than 5 on the Generalized Anxiety Disorder 7 Scale (GAD-7) (Progressing)     Start:  08/29/22    Expected End:  08/28/24       Goal Note      Down from a 12 to 7          STG: Patient will participate in at least 80% of scheduled individual psychotherapy sessions (Progressing)     Start:  08/29/22    Expected End:  08/28/24         STG: Patient will complete at least 80% of assigned homework (Progressing)     Start:  08/29/22    Expected End:  08/28/24         Review results of GAD-7 with the patient to track progress     Start:  08/29/22         Discuss risks and benefits of medication treatment options for this problem and prescribe as indicated (Completed)     Start:  08/29/22    End:  10/02/22      Encourage patient to take psychotropic medication as prescribed (Completed)     Start:  08/29/22    End:  10/02/22      Work with patient to track symptoms, triggers and/or skill use through a mood chart, diary card, or journal (Completed)     Start:  08/29/22    End:  11/06/22  Perform psychoeducation regarding anxiety disorders (Completed)     Start:  08/29/22    End:  10/02/22      Provide patient with educational information and reading material on anxiety, its causes, and symptoms (Completed)     Start:  08/29/22    End:  11/06/22      Work with patient to identify the major components of a recent episode of anxiety: physical symptoms, major thoughts and images, and major behaviors they experienced (Completed)     Start:  08/29/22    End:  11/06/22         ProgressTowards Goals: Progressing  Interventions: CBT, Motivational Interviewing, and Supportive  Suicidal/Homicidal: Nowithout intent/plan  Therapist Response:   Patient was alert and oriented x 5.  He was pleasant, cooperative, maintained good eye contact.  He engaged well in therapy session was dressed casually. [name] presented with anxious mood\affect.  Patient comes in today with primary stressors as illness for bacterial infection in his hair.  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  He reports anger as when he tries to go to a dermatologist he does not feel heard. [name] reports one of the treatments is to cut the infection out in his scalp but due to a knife being used on his head is hesitant to do this.  When he brought up the concerns to a doctor the doctor could dismissed his concerns and did not validate his feelings or concerns.  This made patient irritable and patient continues to try to utilize medications to help with the infection.  Intervention/plan: Weaver spoke with patient about de-escalation techniques to help decrease conflict.  Weaver spoke with patient about utilizing a pros and cons list to make decisions.  Weaver utilized psychoanalytic therapy for patient to express thoughts, feelings and concerns in session utilizing nonjudgmental stance.  Weaver utilized supportive therapy for praise and encouragement.  Weaver utilized motivational interviewing for open-ended questions.   Plan: Return again in 3 weeks.  Diagnosis: GAD (generalized anxiety disorder)  Intermittent explosive disorder in adult  Agoraphobia  Collaboration of Care: Other None today   Patient/Guardian was advised Release of Information must be obtained prior to any record release in order to collaborate their care with an outside provider. Patient/Guardian was advised if they have not already done so to contact the registration department to sign all necessary forms in order for [place] to release information regarding their care.   Consent: Patient/Guardian gives verbal consent for treatment and assignment of benefits for services provided during this visit. Patient/Guardian expressed understanding and agreed to proceed.   Cody [name], Weaver 01/27/2024

## 2024-02-17 ENCOUNTER — Ambulatory Visit (INDEPENDENT_AMBULATORY_CARE_PROVIDER_SITE_OTHER): Payer: Medicaid Other | Admitting: Licensed Clinical Social Worker

## 2024-02-17 DIAGNOSIS — F4000 Agoraphobia, unspecified: Secondary | ICD-10-CM

## 2024-02-17 DIAGNOSIS — F6381 Intermittent explosive disorder: Secondary | ICD-10-CM | POA: Diagnosis not present

## 2024-02-17 DIAGNOSIS — F411 Generalized anxiety disorder: Secondary | ICD-10-CM

## 2024-02-17 NOTE — Progress Notes (Signed)
 THERAPIST PROGRESS NOTE  Virtual Visit via Video Note  I connected with Cody Weaver on 02/17/24 at  2:00 PM EDT by a video enabled telemedicine application and verified that I am speaking with the correct person using two identifiers.  Location: Patient: [facility]  Provider: Providers Home    I discussed the limitations of evaluation and management by telemedicine and the availability of in person appointments. The patient expressed understanding and agreed to proceed.      I discussed the assessment and treatment plan with the patient. The patient was provided an opportunity to ask questions and all were answered. The patient agreed with the plan and demonstrated an understanding of the instructions.   The patient was advised to call back or seek an in-person evaluation if the symptoms worsen or if the condition fails to improve as anticipated.  I provided 40 minutes of non-face-to-face time during this encounter.   [name], LCSW   Participation Level: Active  Behavioral Response: CasualAlertAnxious and Depressed  Type of Therapy: Individual Therapy  Treatment Goals addressed:  Active     Anxiety Disorder CCP Problem  1 Agoraphobia       Identify 3 triggers for anxiety  (Progressing)     Start:  08/29/22    Expected End:  08/28/24         workout 3 x weekly  (Progressing)     Start:  08/29/22    Expected End:  08/28/24         no purging 7/7 days weekly  (Completed/Met)     Start:  08/29/22    Expected End:  08/28/24    Resolved:  01/27/24      maintain 20 hour weekly of employment  (Not Progressing)     Start:  08/29/22    Expected End:  08/28/24         create 3 coping skills for anxiety  (Progressing)     Start:  08/29/22    Expected End:  08/28/24         LTG: Patient will score less than 5 on the Generalized Anxiety Disorder 7 Scale (GAD-7) (Progressing)     Start:  08/29/22    Expected End:  08/28/24       Goal Note      Down from a 12 to 7          STG: Patient will participate in at least 80% of scheduled individual psychotherapy sessions (Progressing)     Start:  08/29/22    Expected End:  08/28/24         STG: Patient will complete at least 80% of assigned homework (Progressing)     Start:  08/29/22    Expected End:  08/28/24         Review results of GAD-7 with the patient to track progress     Start:  08/29/22         Discuss risks and benefits of medication treatment options for this problem and prescribe as indicated (Completed)     Start:  08/29/22    End:  10/02/22      Encourage patient to take psychotropic medication as prescribed (Completed)     Start:  08/29/22    End:  10/02/22      Work with patient to track symptoms, triggers and/or skill use through a mood chart, diary card, or journal (Completed)     Start:  08/29/22    End:  11/06/22  Perform psychoeducation regarding anxiety disorders (Completed)     Start:  08/29/22    End:  10/02/22      Provide patient with educational information and reading material on anxiety, its causes, and symptoms (Completed)     Start:  08/29/22    End:  11/06/22      Work with patient to identify the major components of a recent episode of anxiety: physical symptoms, major thoughts and images, and major behaviors they experienced (Completed)     Start:  08/29/22    End:  11/06/22        ProgressTowards Goals: Progressing  Interventions: CBT, Motivational Interviewing, and Supportive   Suicidal/Homicidal: Nowithout intent/plan  Therapist Response:    Patient was alert and oriented x 5.  He was pleasant, cooperative, maintained good eye contact.  Patient engaged well in therapy session was dressed casually.  He presented today with anxious mood\affect.  Patient reports today stressors were grief and loss and body dysmorphia.  He reports that he still processing through the grief and loss of his grandmother.  He  reports utilizing coping skills such as visiting her grave and utilizing family support to help him get through his grief.  He reports that he is staying with his grandfather for the time being which has been helpful for the both of them.  Other stressors for patient and her body dysmorphia.  He reports that he feels overweight.  Patient states that he is in a calorie deficit.  He reports that he is trying to eat healthier and exercise more.  Patient reports goals of getting a "6 pack".  Patient reports trying to eat cleaner to maximize weight loss potential.  Intervention/plan: LCSW utilized psychoanalytic therapy for patient to express thoughts, feelings and concerns in session utilizing nonjudgmental stance.  LCSW utilized supportive therapy for praise and encouragement.  LCSW educated patient on calorie deficits versus calories or pluses.  LCSW educated patient upon utilization of help such as "my fitness pal" this is to help track calories and make sure that you are not eating too little or too much.  LCSW spoke with patient about the benefits of a balanced diet for his mental health.  LCSW educated patient house calorie deficits can mimic depression symptoms.  Plan: Return again in 4 weeks.  Diagnosis: GAD (generalized anxiety disorder)  Agoraphobia  Intermittent explosive disorder in adult  Collaboration of Care: Other None today   Patient/Guardian was advised Release of Information must be obtained prior to any record release in order to collaborate their care with an outside provider. Patient/Guardian was advised if they have not already done so to contact the registration department to sign all necessary forms in order for us  to release information regarding their care.   Consent: Patient/Guardian gives verbal consent for treatment and assignment of benefits for services provided during this visit. Patient/Guardian expressed understanding and agreed to proceed.   [name],  LCSW 02/17/2024

## 2024-03-09 ENCOUNTER — Ambulatory Visit (HOSPITAL_COMMUNITY): Admitting: Licensed Clinical Social Worker

## 2024-03-09 ENCOUNTER — Encounter (HOSPITAL_COMMUNITY): Payer: Self-pay

## 2024-04-02 ENCOUNTER — Ambulatory Visit (HOSPITAL_COMMUNITY): Admitting: Licensed Clinical Social Worker

## 2024-04-02 DIAGNOSIS — F6381 Intermittent explosive disorder: Secondary | ICD-10-CM

## 2024-04-02 DIAGNOSIS — F411 Generalized anxiety disorder: Secondary | ICD-10-CM | POA: Diagnosis not present

## 2024-04-02 DIAGNOSIS — F4000 Agoraphobia, unspecified: Secondary | ICD-10-CM

## 2024-04-02 NOTE — Progress Notes (Addendum)
 THERAPIST PROGRESS NOTE  Virtual Visit via Video Note  I connected with Cody Weaver on 04/02/24 at  1:00 PM EDT by a video enabled telemedicine application and verified that I am speaking with the correct person using two identifiers.  Location: Patient: Cody Weaver  Provider: Providers Home    I discussed the limitations of evaluation and management by telemedicine and the availability of in person appointments. The patient expressed understanding and agreed to proceed.   I discussed the assessment and treatment plan with the patient. The patient was provided an opportunity to ask questions and all were answered. The patient agreed with the plan and demonstrated an understanding of the instructions.   The patient was advised to call back or seek an in-person evaluation if the symptoms worsen or if the condition fails to improve as anticipated.  I provided 45 minutes of non-face-to-face time during this encounter.   Cody Weaver, Cody Weaver   Participation Level: Active  Behavioral Response: CasualAlertAnxious and Depressed  Type of Therapy: Individual Therapy  Treatment Goals addressed:  Active     Anxiety Disorder CCP Problem  1 Agoraphobia       Identify 3 triggers for anxiety  (Progressing)     Start:  08/29/22    Expected End:  08/28/24         workout 3 x weekly  (Progressing)     Start:  08/29/22    Expected End:  08/28/24         no purging 7/7 days weekly  (Completed/Met)     Start:  08/29/22    Expected End:  08/28/24    Resolved:  01/27/24      maintain 20 hour weekly of employment  (Not Progressing)     Start:  08/29/22    Expected End:  08/28/24         create 3 coping skills for anxiety  (Progressing)     Start:  08/29/22    Expected End:  08/28/24         LTG: Patient will score less than 5 on the Generalized Anxiety Disorder 7 Scale (GAD-7) (Progressing)     Start:  08/29/22    Expected End:  08/28/24       Goal Note      Down from a 12 to 7          STG: Patient will participate in at least 80% of scheduled individual psychotherapy sessions (Progressing)     Start:  08/29/22    Expected End:  08/28/24         STG: Patient will complete at least 80% of assigned homework (Progressing)     Start:  08/29/22    Expected End:  08/28/24         Review results of GAD-7 with the patient to track progress     Start:  08/29/22         Discuss risks and benefits of medication treatment options for this problem and prescribe as indicated (Completed)     Start:  08/29/22    End:  10/02/22      Encourage patient to take psychotropic medication as prescribed (Completed)     Start:  08/29/22    End:  10/02/22      Work with patient to track symptoms, triggers and/or skill use through a mood chart, diary card, or journal (Completed)     Start:  08/29/22    End:  11/06/22  Perform psychoeducation regarding anxiety disorders (Completed)     Start:  08/29/22    End:  10/02/22      Provide patient with educational information and reading material on anxiety, its causes, and symptoms (Completed)     Start:  08/29/22    End:  11/06/22      Work with patient to identify the major components of a recent episode of anxiety: physical symptoms, major thoughts and images, and major behaviors they experienced (Completed)     Start:  08/29/22    End:  11/06/22         ProgressTowards Goals: Progressing  Interventions: CBT and Motivational Interviewing    Suicidal/Homicidal: Nowithout intent/plan  Therapist Response:    Patient was alert and oriented x 5.  He was pleasant, cooperative, maintained good eye contact.  Cody Weaver engaged well in therapy session was dressed casually.  Patient presented with anxious mood\affect.  Cody Weaver comes in today late for session and overwhelmed due to caring for a family member's children.  Patient reports that normally it is just him and his grandfather in the  household, but he is currently caring for the children of this undisclosed family member.  Patient was overwhelmed due to their behavior, and he believes that they are not being raised correctly and do not have discipline.  Cody Weaver states he is having feelings of transference for when he was a child and not having a father.  He reports these children also do not have a father that is a good Dance movement psychotherapist and is always absent.  Patient reports frustration that therapy is types of people in the world, and he does not understand how people can abandon their children.  Intervention/plan: Cody Weaver utilized supportive therapy for praise and encouragement.  Cody Weaver utilized person Center therapy for her empowerment. Cody Weaver utilized psychoanalytic therapy for patient to express thoughts, feelings and emotions and nonjudgmental environment.  Cody Weaver validated feelings in session.  Cody Weaver utilize open-ended questions and reflective listening for motivational interviewing.  Cody Weaver educated patient on setting healthy boundaries  Plan: Return again in 4 weeks.  Diagnosis: Agoraphobia  GAD (generalized anxiety disorder)  Intermittent explosive disorder in adult  Collaboration of Care: Other None today   Patient/Guardian was advised Release of Information must be obtained prior to any record release in order to collaborate their care with an outside provider. Patient/Guardian was advised if they have not already done so to contact the registration department to sign all necessary forms in order for us  to release information regarding their care.   Consent: Patient/Guardian gives verbal consent for treatment and assignment of benefits for services provided during this visit. Patient/Guardian expressed understanding and agreed to proceed.   Cody Weaver, Cody Weaver 04/02/2024

## 2024-04-25 IMAGING — CR DG CHEST 2V
2 series · 2 of 2 positions shown · non-contrast
Comparison: December 23, 2019

CLINICAL DATA: chest pain

EXAM:
CHEST - 2 VIEW

[chest pa]
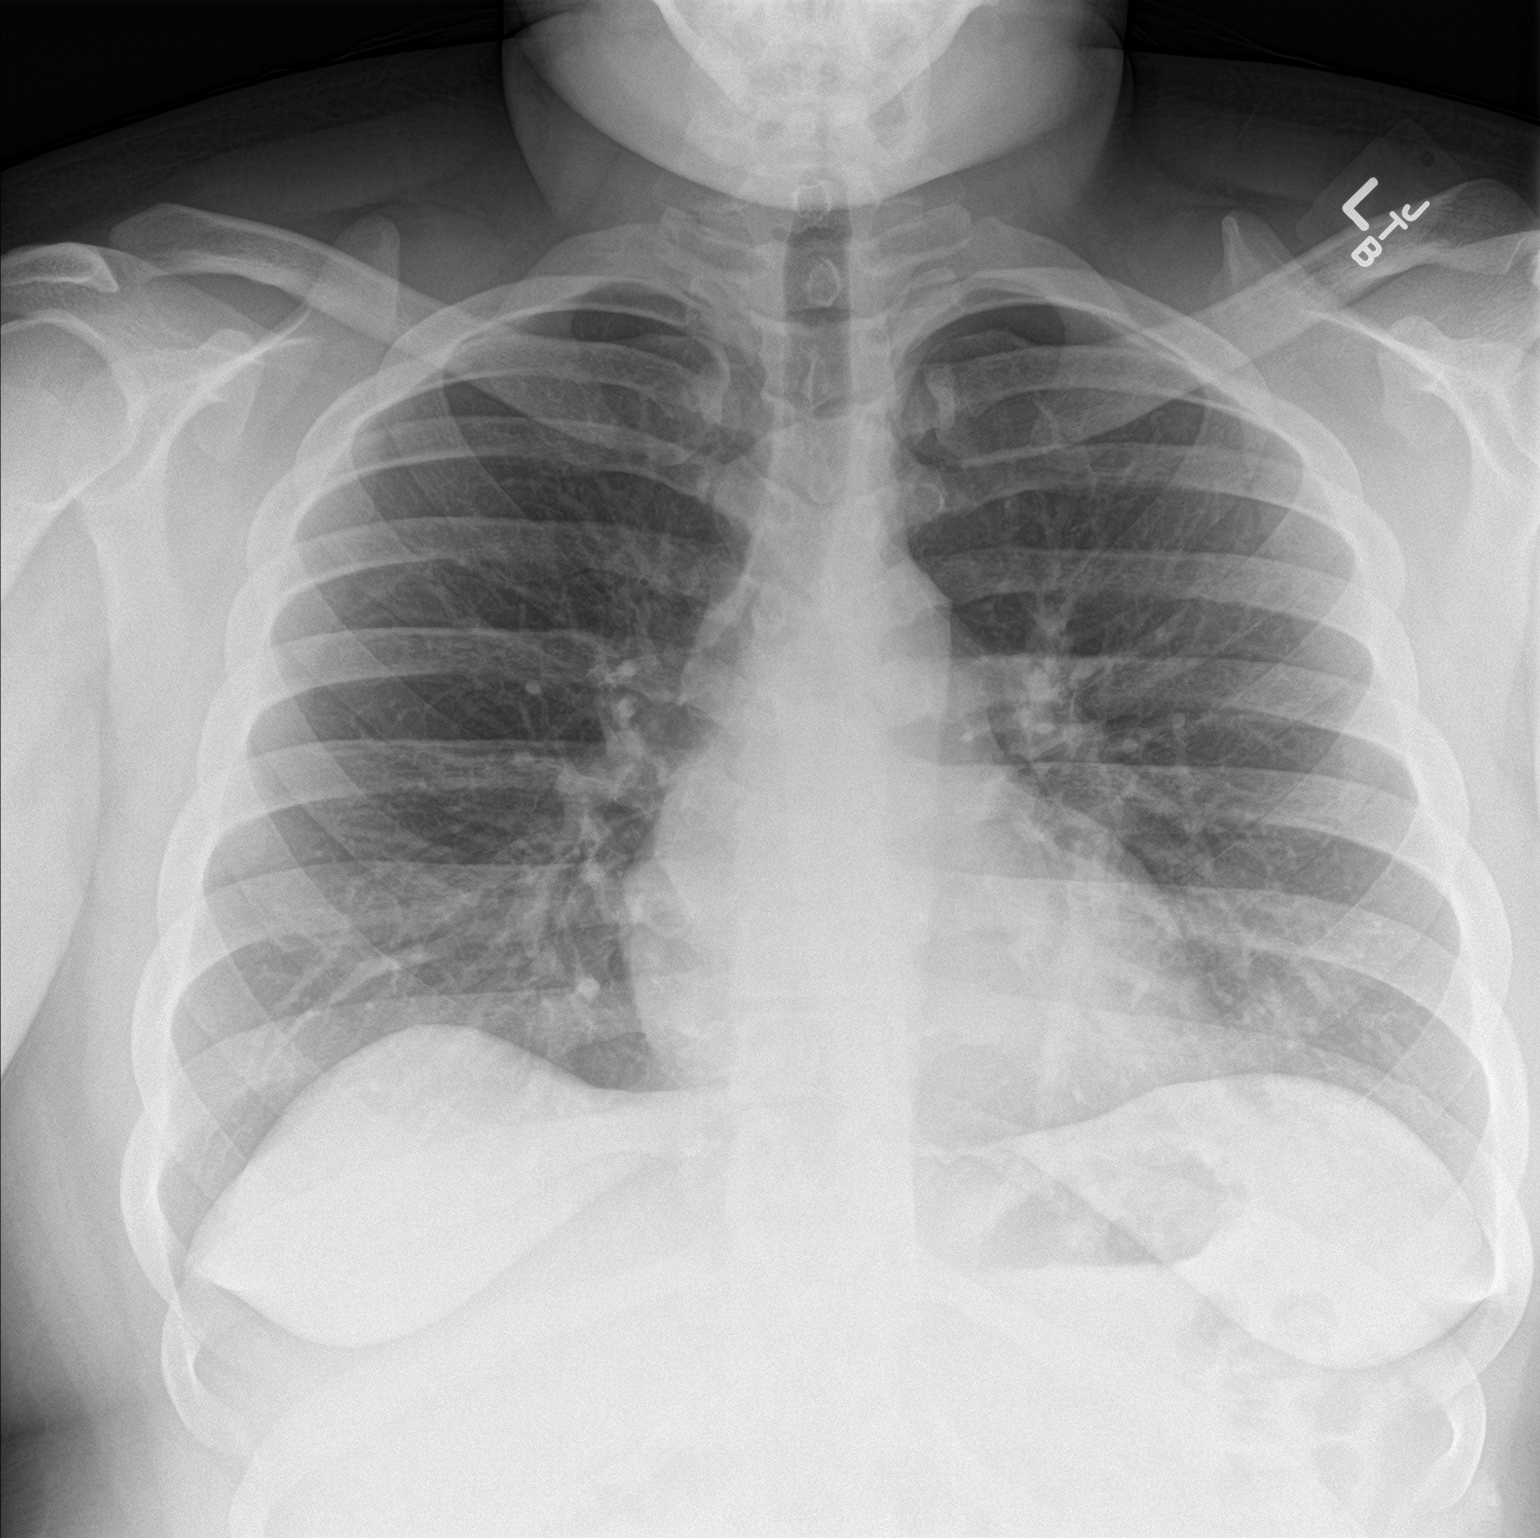

[chest lat]
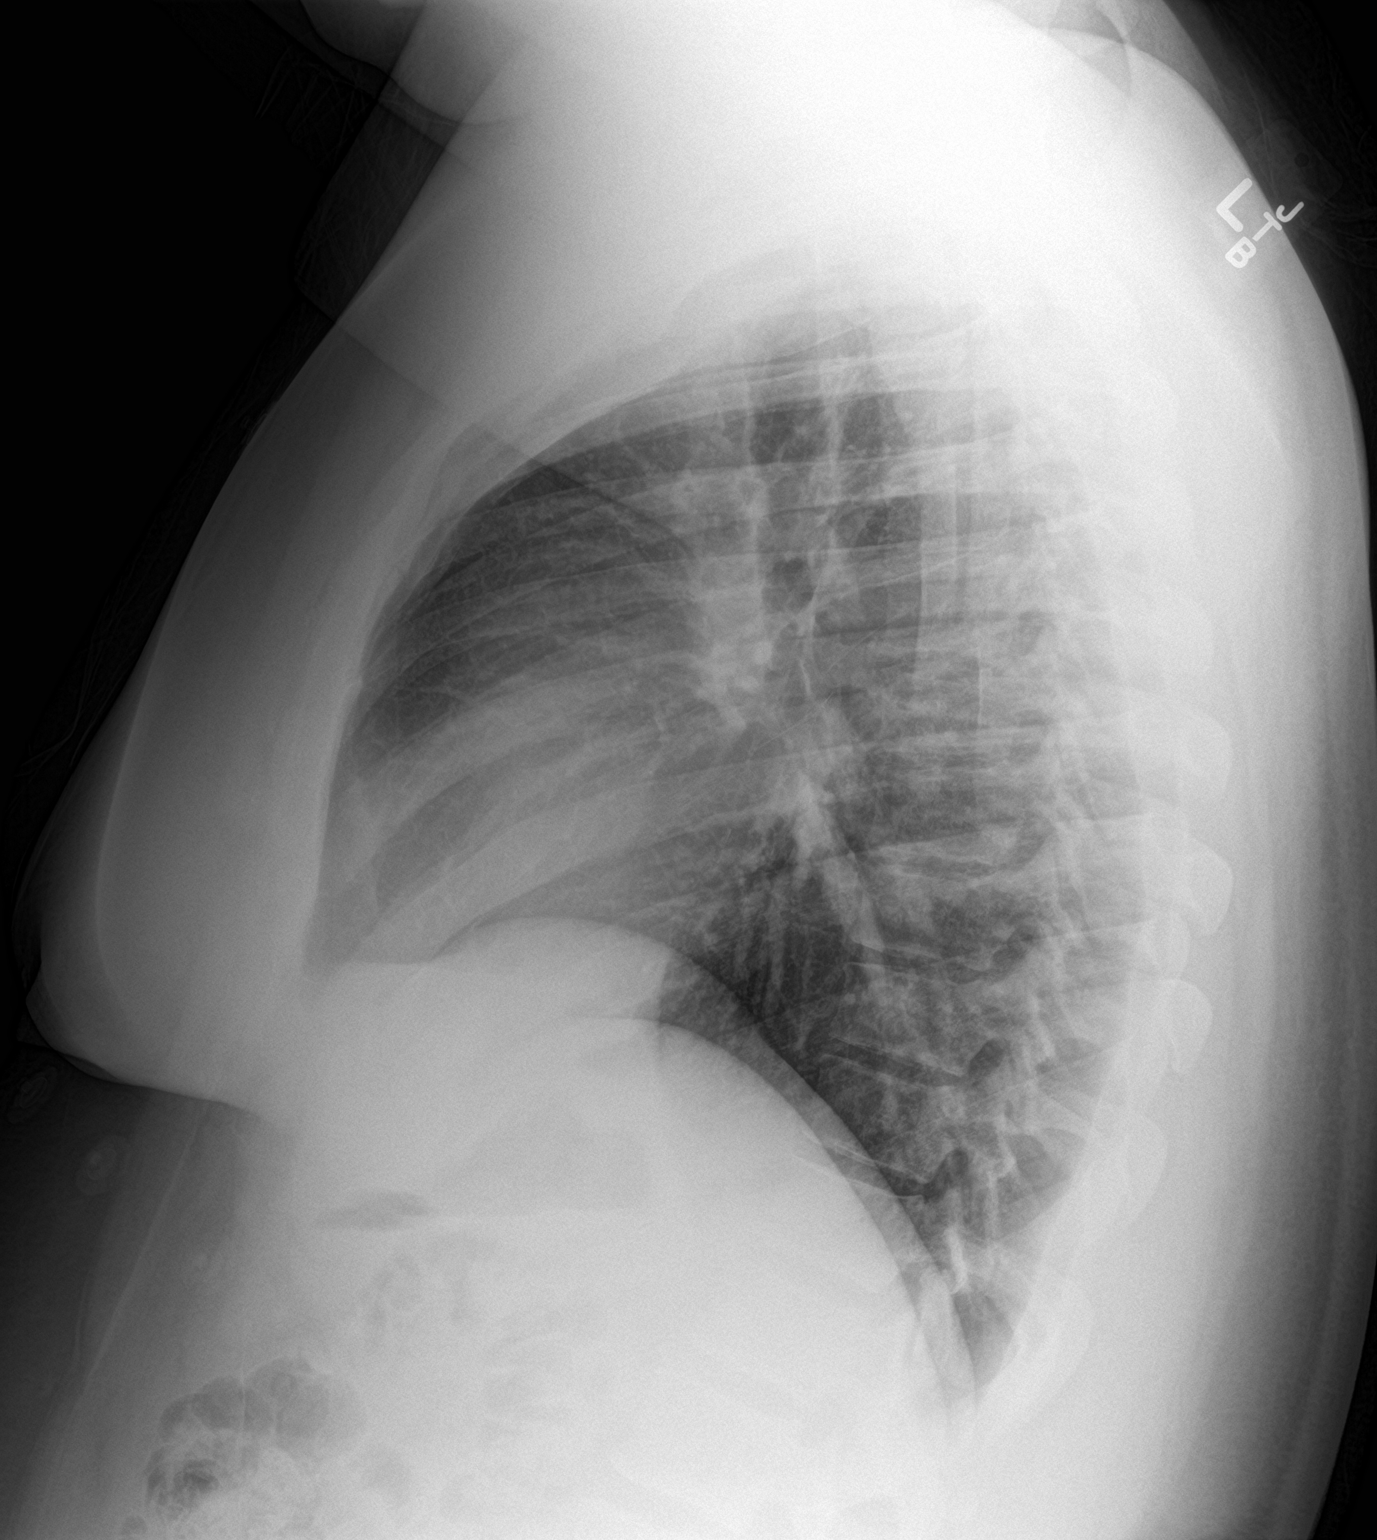

[2 of 2 positions shown; findings below may reference images not displayed]

FINDINGS: The heart size and mediastinal contours are within normal limits.
Both lungs are clear. The visualized skeletal structures are
unremarkable.
IMPRESSION: No active cardiopulmonary disease.

## 2024-04-30 ENCOUNTER — Ambulatory Visit (HOSPITAL_COMMUNITY): Admitting: Licensed Clinical Social Worker

## 2024-05-25 ENCOUNTER — Ambulatory Visit (INDEPENDENT_AMBULATORY_CARE_PROVIDER_SITE_OTHER): Admitting: Licensed Clinical Social Worker

## 2024-05-25 DIAGNOSIS — F6381 Intermittent explosive disorder: Secondary | ICD-10-CM | POA: Diagnosis not present

## 2024-05-25 DIAGNOSIS — F4000 Agoraphobia, unspecified: Secondary | ICD-10-CM | POA: Diagnosis not present

## 2024-05-25 DIAGNOSIS — F411 Generalized anxiety disorder: Secondary | ICD-10-CM

## 2024-05-25 NOTE — Progress Notes (Signed)
 THERAPIST PROGRESS NOTE  Virtual Visit via Video Note  I connected with Hollice DELENA Lewis on 05/25/24 at  2:00 PM EDT by a video enabled telemedicine application and verified that I am speaking with the correct person using two identifiers.  [facility]: [facility]  Provider: [facility]    I discussed the limitations of evaluation and management by telemedicine and the availability of in person appointments. The patient expressed understanding and agreed to proceed.    I discussed the assessment and treatment plan with the patient. The patient was provided an opportunity to ask questions and all were answered. The patient agreed with the plan and demonstrated an understanding of the instructions.   The patient was advised to call back or seek an in-person evaluation if the symptoms worsen or if the condition fails to improve as anticipated.  I provided 45 minutes of non-face-to-face time during this encounter.   Juliene GORMAN Patee, LCSW   Participation Level: Active  Behavioral Response: CasualAlertAnxious and Depressed  Type of Therapy: Individual Therapy  Treatment Goals addressed:  Active     Anxiety Disorder CCP Problem  1 Agoraphobia       Identify 3 triggers for anxiety  (Progressing)     Start:  08/29/22    Expected End:  08/28/24         workout 3 x weekly  (Progressing)     Start:  08/29/22    Expected End:  08/28/24         no purging 7/7 days weekly  (Completed/Met)     Start:  08/29/22    Expected End:  08/28/24    Resolved:  01/27/24      maintain 20 hour weekly of employment  (Progressing)     Start:  08/29/22    Expected End:  08/28/24         create 3 coping skills for anxiety  (Progressing)     Start:  08/29/22    Expected End:  08/28/24         LTG: Patient will score less than 5 on the Generalized Anxiety Disorder 7 Scale (GAD-7) (Progressing)     Start:  08/29/22    Expected End:  08/28/24       Goal Note     Down  from a 12 to 7          STG: Patient will participate in at least 80% of scheduled individual psychotherapy sessions (Progressing)     Start:  08/29/22    Expected End:  08/28/24         STG: Patient will complete at least 80% of assigned homework (Progressing)     Start:  08/29/22    Expected End:  08/28/24         Review results of GAD-7 with the patient to track progress     Start:  08/29/22         Discuss risks and benefits of medication treatment options for this problem and prescribe as indicated (Completed)     Start:  08/29/22    End:  10/02/22      Encourage patient to take psychotropic medication as prescribed (Completed)     Start:  08/29/22    End:  10/02/22      Work with patient to track symptoms, triggers and/or skill use through a mood chart, diary card, or journal (Completed)     Start:  08/29/22    End:  11/06/22  Perform psychoeducation regarding anxiety disorders (Completed)     Start:  08/29/22    End:  10/02/22      Provide patient with educational information and reading material on anxiety, its causes, and symptoms (Completed)     Start:  08/29/22    End:  11/06/22      Work with patient to identify the major components of a recent episode of anxiety: physical symptoms, major thoughts and images, and major behaviors they experienced (Completed)     Start:  08/29/22    End:  11/06/22         ProgressTowards Goals: Progressing  Interventions: CBT, Motivational Interviewing, and Supportive    Suicidal/Homicidal: Nowithout intent/plan Therapist Response:    Patient was alert and oriented x 5.  He was pleasant, cooperative, maintained good eye contact.  Patient engaged well in therapy session was dressed casually.  He presented with irritable, anxious, and depressed mood\affect.    Nancylee reports frustration due to setting healthy boundaries with family members and people within his life.  He used the example of giving money  to a homeless person even though he does not have it or giving up his seat on the bus to a person that needs it.  Patient states that he will do these things even if it causes him mental or physical pain.  He reports that he struggles with setting healthy boundaries such as knowing his limits and saying now.  Patient reports that he feels that everything is stacked up against me.  Patient used the example of things happening at the most inconvenient time such as like Internet outages and being asked for favors.  Intervention/plan: LCSW utilized worksheet for DBT for acronym TIPP: This stands for temperature, intensive exercise, progressive muscle relaxation, and paced breathing.  LCSW also educated patient on the use of assertive language such as tone of voice, being able to say now, respecting self, and respecting others.  LCSW educated patient on setting healthy boundaries and provided patient worksheet on the topic.  All worksheets were sent via email.  LCSW utilized psychoanalytic therapy for patient to express thoughts, feelings and emotions and nonjudgmental environment.  LCSW utilized supportive therapy for praise and encouragement.      Plan: Return again in 6 weeks.  Diagnosis: GAD (generalized anxiety disorder)  Agoraphobia  Intermittent explosive disorder in adult  Collaboration of Care: Other None today   Patient/Guardian was advised Release of Information must be obtained prior to any record release in order to collaborate their care with an outside provider. Patient/Guardian was advised if they have not already done so to contact the registration department to sign all necessary forms in order for us  to release information regarding their care.   Consent: Patient/Guardian gives verbal consent for treatment and assignment of benefits for services provided during this visit. Patient/Guardian expressed understanding and agreed to proceed.   Juliene GORMAN Patee, LCSW 05/25/2024

## 2024-07-30 ENCOUNTER — Telehealth (HOSPITAL_COMMUNITY): Payer: Self-pay | Admitting: Licensed Clinical Social Worker

## 2024-07-30 ENCOUNTER — Encounter (HOSPITAL_COMMUNITY): Payer: Self-pay

## 2024-07-30 ENCOUNTER — Ambulatory Visit (HOSPITAL_COMMUNITY): Admitting: Licensed Clinical Social Worker

## 2024-07-30 NOTE — Telephone Encounter (Signed)
 LCSW sent to links to patient's cell phone listed in epic with no response.  LCSW followed up with a phone call with no answer.  LCSW left HIPAA compliant voicemail.  LCSW waited until 222 before disconnecting for 2 PM appointment.  Patient to be marked as a no-show for today's session.

## 2024-08-03 ENCOUNTER — Ambulatory Visit (HOSPITAL_COMMUNITY): Admitting: Licensed Clinical Social Worker

## 2024-08-03 DIAGNOSIS — F4000 Agoraphobia, unspecified: Secondary | ICD-10-CM | POA: Diagnosis not present

## 2024-08-03 DIAGNOSIS — F411 Generalized anxiety disorder: Secondary | ICD-10-CM | POA: Diagnosis not present

## 2024-08-03 DIAGNOSIS — F6381 Intermittent explosive disorder: Secondary | ICD-10-CM

## 2024-08-03 NOTE — Progress Notes (Signed)
 THERAPIST PROGRESS NOTE  Virtual Visit via Video Note  I connected with Cody Weaver on 08/03/24 at  2:00 PM EDT by a video enabled telemedicine application and verified that I am speaking with the correct person using two identifiers.  Location: Patient: [facility]  Provider: Providers Home    I discussed the limitations of evaluation and management by telemedicine and the availability of in person appointments. The patient expressed understanding and agreed to proceed.     I discussed the assessment and treatment plan with the patient. The patient was provided an opportunity to ask questions and all were answered. The patient agreed with the plan and demonstrated an understanding of the instructions.   The patient was advised to call back or seek an in-person evaluation if the symptoms worsen or if the condition fails to improve as anticipated.  I provided 45 minutes of non-face-to-face time during this encounter.  Juliene GORMAN Patee, LCSW   Participation Level: Active  Behavioral Response: CasualAlertAnxious and Depressed  Type of Therapy: Individual Therapy  Treatment Goals addressed:  Active     Anxiety Disorder CCP Problem  1 Agoraphobia       Identify 3 triggers for anxiety  (Progressing)     Start:  08/29/22    Expected End:  08/28/24         workout 3 x weekly  (Progressing)     Start:  08/29/22    Expected End:  08/28/24         no purging 7/7 days weekly  (Completed/Met)     Start:  08/29/22    Expected End:  08/28/24    Resolved:  01/27/24      maintain 20 hour weekly of employment  (Not Progressing)     Start:  08/29/22    Expected End:  08/28/24         create 3 coping skills for anxiety  (Progressing)     Start:  08/29/22    Expected End:  08/28/24         LTG: Patient will score less than 5 on the Generalized Anxiety Disorder 7 Scale (GAD-7) (Progressing)     Start:  08/29/22    Expected End:  08/28/24       Goal Note      Down from a 12 to 7          STG: Patient will participate in at least 80% of scheduled individual psychotherapy sessions (Progressing)     Start:  08/29/22    Expected End:  08/28/24         STG: Patient will complete at least 80% of assigned homework (Progressing)     Start:  08/29/22    Expected End:  08/28/24         Review results of GAD-7 with the patient to track progress     Start:  08/29/22         Discuss risks and benefits of medication treatment options for this problem and prescribe as indicated (Completed)     Start:  08/29/22    End:  10/02/22      Encourage patient to take psychotropic medication as prescribed (Completed)     Start:  08/29/22    End:  10/02/22      Work with patient to track symptoms, triggers and/or skill use through a mood chart, diary card, or journal (Completed)     Start:  08/29/22    End:  11/06/22  Perform psychoeducation regarding anxiety disorders (Completed)     Start:  08/29/22    End:  10/02/22      Provide patient with educational information and reading material on anxiety, its causes, and symptoms (Completed)     Start:  08/29/22    End:  11/06/22      Work with patient to identify the major components of a recent episode of anxiety: physical symptoms, major thoughts and images, and major behaviors they experienced (Completed)     Start:  08/29/22    End:  11/06/22         ProgressTowards Goals: Progressing  Interventions: CBT, Motivational Interviewing, and Supportive    Suicidal/Homicidal: Nowithout intent/plan  Therapist Response:     Patient was alert and oriented x 5.  He was pleasant, cooperative, maintained good eye contact.  Patient engaged well in therapy session and was dressed casually.  He presented with anxious mood\affect.  Patient reports primary stressors as illness.  He reports that he struggles with stomach issues.  Patient refers to some pancreas pain.  He reports that this  happens when he eats bad.  He reports that he struggles when he eats greasy food such as pizza.  Patient reports that he would like to prioritize his health by engaging in meal planning and exercise.  Other stressors for patient include future outlook.  He reports that he still struggles finding activities that will help further his career path whether it is with school or in a trade.  Patient states that he spends most of his days at his grandfather's house helping him but knows that this cannot be his long-term plan.  Intervention/plan: LCSW utilized psychoanalytic therapy for patient to express thoughts, feelings and emotions and nonjudgmental environment.  LCSW utilized supportive therapy for praise and encouragement.  LCSW utilized person centered therapy for empowerment.  LCSW utilized motivational interviewing for open-ended questions and reflective listening.  Plan: Return again in 3 weeks.  Diagnosis: GAD (generalized anxiety disorder)  Agoraphobia  Intermittent explosive disorder in adult  Collaboration of Care: Other Continued individual therapy   Patient/Guardian was advised Release of Information must be obtained prior to any record release in order to collaborate their care with an outside provider. Patient/Guardian was advised if they have not already done so to contact the registration department to sign all necessary forms in order for us  to release information regarding their care.   Consent: Patient/Guardian gives verbal consent for treatment and assignment of benefits for services provided during this visit. Patient/Guardian expressed understanding and agreed to proceed.   Juliene GORMAN Patee, LCSW 08/03/2024

## 2024-08-18 ENCOUNTER — Telehealth (HOSPITAL_COMMUNITY): Payer: Self-pay | Admitting: Licensed Clinical Social Worker

## 2024-08-18 NOTE — Telephone Encounter (Signed)
 Patient's mother, Cody Weaver, called to leave message for Cody Weaver. Patient gave verbal consent over the speak with her. Patient was advised that they needed to sign an ROI for further communications.  Patient's mother's phone number [phone] and her e-mail [email or web address] . Cody Weaver needs a letter for food stamp benefits.Patient does not know when he'll be returning to the area and needs the letter. Cody Weaver's e-mail is [email or web address]. Cody Weaver will e-mail ROI form to patient.  Please advise. Thank you.

## 2024-08-24 ENCOUNTER — Ambulatory Visit (INDEPENDENT_AMBULATORY_CARE_PROVIDER_SITE_OTHER): Admitting: Licensed Clinical Social Worker

## 2024-08-24 DIAGNOSIS — F411 Generalized anxiety disorder: Secondary | ICD-10-CM | POA: Diagnosis not present

## 2024-08-24 DIAGNOSIS — F6381 Intermittent explosive disorder: Secondary | ICD-10-CM

## 2024-08-24 DIAGNOSIS — F4000 Agoraphobia, unspecified: Secondary | ICD-10-CM

## 2024-08-24 NOTE — Progress Notes (Unsigned)
 THERAPIST PROGRESS NOTE  Virtual Visit via Video Note  I connected with Cody Weaver on 08/24/24 at  3:00 PM EDT by a video enabled telemedicine application and verified that I am speaking with the correct person using two identifiers.  Location: Patient:  Provider:    I discussed the limitations of evaluation and management by telemedicine and the availability of in person appointments. The patient expressed understanding and agreed to proceed.    I discussed the assessment and treatment plan with the patient. The patient was provided an opportunity to ask questions and all were answered. The patient agreed with the plan and demonstrated an understanding of the instructions.   The patient was advised to call back or seek an in-person evaluation if the symptoms worsen or if the condition fails to improve as anticipated.  I provided 60 minutes of non-face-to-face time during this encounter.   Juliene GORMAN Patee, LCSW   Participation Level: Active  Behavioral Response: CasualAlertAnxious  Type of Therapy: Individual Therapy  Treatment Goals addressed:  Active     Anxiety Disorder CCP Problem  1 Agoraphobia       Identify 3 triggers for anxiety  (Progressing)     Start:  08/29/22    Expected End:  08/28/24         workout 3 x weekly  (Progressing)     Start:  08/29/22    Expected End:  08/28/24         no purging 7/7 days weekly  (Completed/Met)     Start:  08/29/22    Expected End:  08/28/24    Resolved:  01/27/24      maintain 20 hour weekly of employment  (Not Progressing)     Start:  08/29/22    Expected End:  08/28/24         create 3 coping skills for anxiety  (Progressing)     Start:  08/29/22    Expected End:  08/28/24         LTG: Patient will score less than 5 on the Generalized Anxiety Disorder 7 Scale (GAD-7) (Progressing)     Start:  08/29/22    Expected End:  08/28/24       Goal Note     Down from a 12 to 7          STG: Patient  will participate in at least 80% of scheduled individual psychotherapy sessions (Progressing)     Start:  08/29/22    Expected End:  08/28/24         STG: Patient will complete at least 80% of assigned homework (Progressing)     Start:  08/29/22    Expected End:  08/28/24         Review results of GAD-7 with the patient to track progress     Start:  08/29/22         Discuss risks and benefits of medication treatment options for this problem and prescribe as indicated (Completed)     Start:  08/29/22    End:  10/02/22      Encourage patient to take psychotropic medication as prescribed (Completed)     Start:  08/29/22    End:  10/02/22      Work with patient to track symptoms, triggers and/or skill use through a mood chart, diary card, or journal (Completed)     Start:  08/29/22    End:  11/06/22      Perform psychoeducation regarding  anxiety disorders (Completed)     Start:  08/29/22    End:  10/02/22      Provide patient with educational information and reading material on anxiety, its causes, and symptoms (Completed)     Start:  08/29/22    End:  11/06/22      Work with patient to identify the major components of a recent episode of anxiety: physical symptoms, major thoughts and images, and major behaviors they experienced (Completed)     Start:  08/29/22    End:  11/06/22         ProgressTowards Goals: Progressing  Interventions: CBT, Motivational Interviewing, Supportive, and Reframing  Summary: Cody Weaver is a 28 y.o. male who presents with   Suicidal/Homicidal: Nowithout intent/plan  Therapist Response:     Plan: Return again in 3 weeks.  Diagnosis: GAD (generalized anxiety disorder)  Agoraphobia  Intermittent explosive disorder in adult  Collaboration of Care: Other continued individual therapy through [facility].  LCSW notified patient of transfer to new facility as of December 14.  Patient agreeable to  transfer to new therapist and continue medication management through [facility].  Patient/Guardian was advised Release of Information must be obtained prior to any record release in order to collaborate their care with an outside provider. Patient/Guardian was advised if they have not already done so to contact the registration department to sign all necessary forms in order for us  to release information regarding their care.   Consent: Patient/Guardian gives verbal consent for treatment and assignment of benefits for services provided during this visit. Patient/Guardian expressed understanding and agreed to proceed.   Juliene GORMAN Patee, LCSW 08/24/2024

## 2024-09-10 ENCOUNTER — Telehealth (HOSPITAL_COMMUNITY): Payer: Self-pay | Admitting: Licensed Clinical Social Worker

## 2024-09-10 ENCOUNTER — Ambulatory Visit (HOSPITAL_COMMUNITY): Admitting: Licensed Clinical Social Worker

## 2024-09-10 ENCOUNTER — Encounter (HOSPITAL_COMMUNITY): Payer: Self-pay

## 2024-09-10 NOTE — Telephone Encounter (Signed)
 LCSW sent virtual session links to the patient's phone at 1:04 PM and again at 1:09 PM for the scheduled 1:00 PM appointment. The patient did not enter the virtual session. LCSW attempted to contact the patient by phone; however, the voicemail box was full, and a message could not be left. LCSW remained in the virtual session until 1:15 PM before disconnecting. The patient will be marked as a no-show for this appointment.

## 2024-10-01 ENCOUNTER — Ambulatory Visit (INDEPENDENT_AMBULATORY_CARE_PROVIDER_SITE_OTHER): Admitting: Licensed Clinical Social Worker

## 2024-10-01 DIAGNOSIS — F6381 Intermittent explosive disorder: Secondary | ICD-10-CM

## 2024-10-01 DIAGNOSIS — F411 Generalized anxiety disorder: Secondary | ICD-10-CM | POA: Diagnosis not present

## 2024-10-01 NOTE — Progress Notes (Signed)
 THERAPIST PROGRESS NOTE  Virtual Visit via Video Note  I connected with Cody Weaver on 10/01/24 at  3:00 PM EST by a video enabled telemedicine application and verified that I am speaking with the correct person using two identifiers.  Location: Patient: Cody Weaver  Provider: Providers Home    I discussed the limitations of evaluation and management by telemedicine and the availability of in person appointments. The patient expressed understanding and agreed to proceed.  I discussed the assessment and treatment plan with the patient. The patient was provided an opportunity to ask questions and all were answered. The patient agreed with the plan and demonstrated an understanding of the instructions.    The patient was advised to call back or seek an in-person evaluation if the symptoms worsen or if the condition fails to improve as anticipated.  I provided 55 minutes of non-face-to-face time during this encounter.  Juliene GORMAN Patee, LCSW   Participation Level: Active  Behavioral Response: CasualAlertAnxious and Depressed  Type of Therapy: Individual Therapy  Treatment Goals addressed:  Active     Anxiety Disorder CCP Problem  1 Agoraphobia       Identify 3 triggers for anxiety  (Completed/Met)     Start:  08/29/22    Expected End:  02/26/25    Resolved:  10/01/24      workout 3 x weekly  (Progressing)     Start:  08/29/22    Expected End:  02/26/25         no purging 7/7 days weekly  (Completed/Met)     Start:  08/29/22    Expected End:  08/28/24    Resolved:  01/27/24      maintain 20 hour weekly of employment  (Not Applicable)     Start:  08/29/22    Expected End:  02/26/25    Resolved:  10/01/24    Goal Note     Started school for IT          create 3 coping skills for anxiety  (Progressing)     Start:  08/29/22    Expected End:  02/26/25         LTG: Patient will score less than 5 on the Generalized Anxiety Disorder 7 Scale (GAD-7)  (Progressing)     Start:  08/29/22    Expected End:  02/26/25       Goal Note     Down from a 12 to 7          STG: Patient will participate in at least 80% of scheduled individual psychotherapy sessions (Progressing)     Start:  08/29/22    Expected End:  02/26/25         STG: Patient will complete at least 80% of assigned homework (Completed/Met)     Start:  08/29/22    Expected End:  02/26/25    Resolved:  10/01/24      Review results of GAD-7 with the patient to track progress     Start:  08/29/22         Discuss risks and benefits of medication treatment options for this problem and prescribe as indicated (Completed)     Start:  08/29/22    End:  10/02/22      Encourage patient to take psychotropic medication as prescribed (Completed)     Start:  08/29/22    End:  10/02/22      Work with patient to track symptoms, triggers and/or skill use  through a mood chart, diary card, or journal (Completed)     Start:  08/29/22    End:  11/06/22      Perform psychoeducation regarding anxiety disorders (Completed)     Start:  08/29/22    End:  10/02/22      Provide patient with educational information and reading material on anxiety, its causes, and symptoms (Completed)     Start:  08/29/22    End:  11/06/22      Work with patient to identify the major components of a recent episode of anxiety: physical symptoms, major thoughts and images, and major behaviors they experienced (Completed)     Start:  08/29/22    End:  11/06/22         ProgressTowards Goals: Progressing  Interventions: CBT, Motivational Interviewing, and Supportive  Suicidal/Homicidal: Nowithout intent/plan  Therapist Response:   S: Subjective  Patient was alert and oriented 5. He was pleasant, cooperative, maintained good eye contact, was casually dressed, and engaged well in the therapy session. He presented with an anxious mood and appropriate affect.  The patient began the session by  apologizing for missing the previous appointment, explaining that he sometimes struggles with Wi-Fi access at his current residence. The LCSW verbalized understanding and informed him that today would be the final session with this LCSW due to the clinician's transfer to the [place] office. The patient expressed understanding and was informed that he would receive a call from schedulers with new provider information once schedules open in the next 7-10 business days.  The patient reported ongoing struggles with school, specifically with prerequisite courses required for his IT program. He noted that while he excels in certain areas, other foundational courses feel particularly challenging. He stated that he receives good support at home from his grandfather. The patient shared his long-term goals, including securing a stable future with a 401(k), insurance, and a good salary.  He reported symptoms of tension, worry, and rumination. He described being highly sensitive to others' mood and affect, stating that when people around him appear irritable or sad, he begins to feel similarly. He noted that his thoughts can sometimes become irrational, leading him to challenge others and at times being irritable or angry.   O: Objective  Alert and oriented 5  Pleasant, cooperative  Good eye contact  Casually dressed  Anxious mood with appropriate affect  Engaged and forthcoming during session  A: Assessment  The patient presents with anxiety symptoms characterized by tension, worry, rumination, interpersonal sensitivity, and cognitive distortions. Stress related to academic difficulties and concerns about future stability contributes to his anxiety. He demonstrates insight into some of his thought patterns and appears motivated for continued treatment.  He responded well to cognitive-behavioral, psychodynamic, and motivational interviewing interventions. Supportive therapy remains appropriate for  reinforcing progress and promoting self-efficacy.  P: Plan / Interventions  Provided education on common cognitive distortions, including dismissing the positive, jumping to conclusions, and fortune-telling.  Taught assertive and open-ended communication strategies to help reduce interpersonal misunderstandings.  Utilized [employer] Therapy (CBT) for cognitive restructuring and reframing.  Employed psychodynamic therapy to support expression of thoughts, feelings, and emotions in a nonjudgmental environment.  Utilized supportive therapy for praise, validation, and encouragement.  Applied motivational interviewing strategies including open-ended questions, reflective listening, and positive affirmations.  Informed patient that this is the final session with the current LCSW due to transfer to the [place] office.  Advised patient that schedulers will contact him within 7-10 business days  with new provider information at [facility]  Diagnosis: GAD (generalized anxiety disorder)  Intermittent explosive disorder in adult  Collaboration of Care: Other None today   Patient/Guardian was advised Release of Information must be obtained prior to any record release in order to collaborate their care with an outside provider. Patient/Guardian was advised if they have not already done so to contact the registration department to sign all necessary forms in order for us  to release information regarding their care.   Consent: Patient/Guardian gives verbal consent for treatment and assignment of benefits for services provided during this visit. Patient/Guardian expressed understanding and agreed to proceed.   Juliene GORMAN Patee, LCSW 10/01/2024
